# Patient Record
Sex: Female | Born: 1970 | Race: Black or African American | Hispanic: No | Marital: Single | Smoking: Never smoker
Health system: Southern US, Community
[De-identification: ages and names within clinical notes are randomized; demographics above are authoritative.]

## PROBLEM LIST (undated history)

## (undated) DIAGNOSIS — G4730 Sleep apnea, unspecified: Secondary | ICD-10-CM

## (undated) DIAGNOSIS — I10 Essential (primary) hypertension: Secondary | ICD-10-CM

## (undated) DIAGNOSIS — E669 Obesity, unspecified: Secondary | ICD-10-CM

## (undated) DIAGNOSIS — M1990 Unspecified osteoarthritis, unspecified site: Secondary | ICD-10-CM

## (undated) DIAGNOSIS — K219 Gastro-esophageal reflux disease without esophagitis: Secondary | ICD-10-CM

## (undated) HISTORY — PX: CHOLECYSTECTOMY: SHX55

---

## 1999-05-23 HISTORY — PX: CHOLECYSTECTOMY: SHX55

## 2019-05-28 ENCOUNTER — Other Ambulatory Visit: Payer: Self-pay | Admitting: Cardiology

## 2019-05-28 DIAGNOSIS — Z20822 Contact with and (suspected) exposure to covid-19: Secondary | ICD-10-CM

## 2019-05-29 LAB — NOVEL CORONAVIRUS, NAA: SARS-CoV-2, NAA: NOT DETECTED

## 2019-09-25 ENCOUNTER — Encounter (HOSPITAL_COMMUNITY): Payer: Self-pay | Admitting: Emergency Medicine

## 2019-09-25 ENCOUNTER — Emergency Department (HOSPITAL_COMMUNITY): Payer: 59

## 2019-09-25 ENCOUNTER — Emergency Department (HOSPITAL_COMMUNITY)
Admission: EM | Admit: 2019-09-25 | Discharge: 2019-09-26 | Disposition: A | Discharge status: Home / Self Care | Payer: 59 | Attending: Emergency Medicine | Admitting: Emergency Medicine

## 2019-09-25 DIAGNOSIS — W010XXA Fall on same level from slipping, tripping and stumbling without subsequent striking against object, initial encounter: Secondary | ICD-10-CM | POA: Diagnosis not present

## 2019-09-25 DIAGNOSIS — M79605 Pain in left leg: Secondary | ICD-10-CM | POA: Diagnosis present

## 2019-09-25 DIAGNOSIS — Y999 Unspecified external cause status: Secondary | ICD-10-CM | POA: Insufficient documentation

## 2019-09-25 DIAGNOSIS — Y929 Unspecified place or not applicable: Secondary | ICD-10-CM | POA: Diagnosis not present

## 2019-09-25 DIAGNOSIS — W19XXXA Unspecified fall, initial encounter: Secondary | ICD-10-CM

## 2019-09-25 DIAGNOSIS — Y9301 Activity, walking, marching and hiking: Secondary | ICD-10-CM | POA: Insufficient documentation

## 2019-09-25 MED ORDER — HYDROCODONE-ACETAMINOPHEN 5-325 MG PO TABS: 1.0000 | ORAL_TABLET | Freq: Four times a day (QID) | ORAL | 0 refills | Status: AC | PRN

## 2019-09-25 MED ORDER — HYDROCODONE-ACETAMINOPHEN 5-325 MG PO TABS
1.0000 | ORAL_TABLET | Freq: Once | ORAL | Status: AC
  Administered 2019-09-25: 1 via ORAL
  Filled 2019-09-25: qty 1

## 2019-09-25 NOTE — ED Triage Notes (Signed)
Pt arrives via [facility] after having a fall off the sidewalk today. States she tripped and heard a pop in her L upper thigh. C/o pain to leg and states she is unable to bear weight. Received 111mcg fentanyl pta. A/oxx4, denies LOC. Swelling noted to leg.

## 2019-09-25 NOTE — ED Notes (Signed)
Pt transported to CT at this time.

## 2019-09-25 NOTE — ED Provider Notes (Signed)
[facility] EMERGENCY DEPARTMENT Provider Note   CSN: [id number] Arrival date & time: 09/25/19  1431     History Chief Complaint  Patient presents with  . Leg Pain    Brandy [place] is a 49 y.o. female.  HPI Patient reports to the ED for left leg pain.  Earlier today, she was walking down 2 steps.  She slipped, felt a pop, and felt as if her knee gave out.  She is not sure where she felt the pop.  She fell, landing on her right side.  Since that time, she has had pain in the region just above her lateral left knee.  She has not been able to walk or bear weight.  She has no surgical history to the left leg.  She denies any suspected injury on her right side, where she fell.  She denies any LOC associated with fall.    History reviewed. No pertinent past medical history.  There are no problems to display for this patient.   History reviewed. No pertinent surgical history.   OB History   No obstetric history on file.     No family history on file.  Social History   Tobacco Use  . Smoking status: Not on file  Substance Use Topics  . Alcohol use: Not on file  . Drug use: Not on file    Home Medications Prior to Admission medications   Medication Sig Start Date End Date Taking? Authorizing Provider  HYDROcodone-acetaminophen (NORCO/VICODIN) 5-325 MG tablet Take 1 tablet by mouth every 6 (six) hours as needed for up to 3 days for severe pain. 09/25/19 09/28/19  Godfrey Pick, MD    Allergies    Tramadol  Review of Systems   Review of Systems  Constitutional: Negative for activity change, appetite change, chills, fatigue and fever.  HENT: Negative for ear pain and sore throat.   Eyes: Negative for pain and visual disturbance.  Respiratory: Negative for cough and shortness of breath.   Cardiovascular: Negative for chest pain and palpitations.  Gastrointestinal: Negative for abdominal distention, abdominal pain, nausea and vomiting.  Genitourinary: Negative  for dysuria, flank pain, hematuria and pelvic pain.  Musculoskeletal: Positive for arthralgias, gait problem and joint swelling. Negative for back pain and neck pain.  Skin: Negative for color change and rash.  Neurological: Negative for dizziness, seizures, syncope, weakness, light-headedness and headaches.  Hematological: Does not bruise/bleed easily.  All other systems reviewed and are negative.   Physical Exam Updated Vital Signs BP (!) 144/90 (BP Location: Right Arm)   Pulse 99   Temp 100.1 F (37.8 C) (Oral)   Resp 16   LMP 09/17/2019 (Within Days)   SpO2 99%   Physical Exam Vitals and nursing note reviewed.  Constitutional:      General: She is not in acute distress.    Appearance: She is well-developed. She is obese. She is not ill-appearing, toxic-appearing or diaphoretic.  HENT:     Head: Normocephalic and atraumatic.     Right Ear: External ear normal.     Left Ear: External ear normal.     Nose: Nose normal.  Eyes:     Extraocular Movements: Extraocular movements intact.     Conjunctiva/sclera: Conjunctivae normal.  Cardiovascular:     Rate and Rhythm: Normal rate and regular rhythm.     Heart sounds: No murmur.  Pulmonary:     Effort: Pulmonary effort is normal. No respiratory distress.     Breath sounds: Normal  breath sounds. No wheezing.  Chest:     Chest wall: No tenderness.  Abdominal:     General: There is no distension.     Palpations: Abdomen is soft.     Tenderness: There is no abdominal tenderness. There is no right CVA tenderness, left CVA tenderness or rebound.  Musculoskeletal:        General: Swelling, tenderness and signs of injury present.     Cervical back: Neck supple. No rigidity or tenderness.     Right lower leg: No edema.     Left lower leg: No edema.  Skin:    General: Skin is warm and dry.     Capillary Refill: Capillary refill takes less than 2 seconds.     Coloration: Skin is not jaundiced.     Findings: No bruising or  erythema.  Neurological:     General: No focal deficit present.     Mental Status: She is alert and oriented to person, place, and time.     Cranial Nerves: No cranial nerve deficit.     Sensory: No sensory deficit.     Motor: No weakness.  Psychiatric:        Mood and Affect: Mood normal.        Behavior: Behavior normal.     ED Results / Procedures / Treatments   Labs (all labs ordered are listed, but only abnormal results are displayed) Labs Reviewed - No data to display  EKG None  Radiology CT Knee Left Wo Contrast  Result Date: 09/25/2019 CLINICAL DATA:  Knee pain. EXAM: CT OF THE LEFT KNEE WITHOUT CONTRAST TECHNIQUE: Multidetector CT imaging of the LEFT knee was performed according to the standard protocol. Multiplanar CT image reconstructions were also generated. COMPARISON:  X-ray from same day FINDINGS: Bones/Joint/Cartilage There is no acute displaced fracture or dislocation. Mild-to-moderate tricompartmental osteoarthritis is noted. Ligaments Suboptimally assessed by CT. Muscles and Tendons There is slight irregularity of the quadriceps tendon. The patellar tendon appears unremarkable by CT standards. Soft tissues There is a trace suprapatellar joint effusion. IMPRESSION: 1. No acute displaced fracture or dislocation. 2. Slight irregularity of the quadriceps tendon. This could represent a partial tear. A follow-up nonemergent MRI is recommended for further evaluation. 3. Trace joint effusion. 4. Tricompartmental osteoarthritis. Electronically Signed   By: Constance Holster M.D.   On: 09/25/2019 21:08   DG FEMUR MIN 2 VIEWS LEFT  Result Date: 09/25/2019 CLINICAL DATA:  Left anterior knee pain EXAM: LEFT FEMUR 2 VIEWS COMPARISON:  None. FINDINGS: No fracture or malalignment.  Degenerative changes at the knee. IMPRESSION: No acute osseous abnormality Electronically Signed   By: Donavan Foil M.D.   On: 09/25/2019 15:16    Procedures Procedures (including critical care  time)  Medications Ordered in ED Medications  HYDROcodone-acetaminophen (NORCO/VICODIN) 5-325 MG per tablet 1 tablet (1 tablet Oral Given 09/25/19 2024)  HYDROcodone-acetaminophen (NORCO/VICODIN) 5-325 MG per tablet 1 tablet (1 tablet Oral Given 09/25/19 2354)    ED Course  I have reviewed the triage vital signs and the nursing notes.  Pertinent labs & imaging results that were available during my care of the patient were reviewed by me and considered in my medical decision making (see chart for details).    MDM Rules/Calculators/A&P                      Patient is a 49 year old female who presents for acute injury to her left leg.  At time of injury,  but is unsure of where the pop occurred on her leg.  She had a fall, during which she does not suspect any further injuries.  She has since had persistent pain in the superolateral area of her knee.  In the ED, patient is found sitting up in bed, in no acute distress.  There is no evidence of trauma to her right arm, which she landed on when she fell.  She does have pain and in the area of her superior knee/distal thigh.  Pain is worsened with active and passive flexion of knee.  She is able to extend knee although strength of extension is limited by pain.  She does not have any joint line tenderness.  There is no appreciable laxity of knee joint.  There does appear to be a small area of swelling superior to her patella.  Exam is limited due to habitus.   X-ray of the femur, which includes radiographic visualization of the knee is unremarkable.  Given the severity of pain with range of motion of knee, CT of knee was ordered.  CT scan of knee shows possible partial tear of quadriceps tendon, which is consistent with exam.  Knee was immobilized in position of comfort using Ace wrap bandages.  Patient was provided crutches.  She was advised to call orthopedic surgery for follow-up and possible MRI.  Patient stated that she is from [facility] and has an  orthopedic surgeon that she sees at home.  She will follow up with her own orthopedist.  Work note was provided.  Short course of narcotic pain medication was prescribed.  Patient was discharged in stable condition.  Final Clinical Impression(s) / ED Diagnoses Final diagnoses:  Left leg pain    Rx / DC Orders ED Discharge Orders         Ordered    HYDROcodone-acetaminophen (NORCO/VICODIN) 5-325 MG tablet  Every 6 hours PRN     09/25/19 2340           Godfrey Pick, MD 09/26/19 0236    Deno Etienne, DO 09/26/19 1512

## 2019-09-25 NOTE — Progress Notes (Signed)
Orthopedic Tech Progress Note Patient Details:  [place]andy [place] 1970/12/28 [id number]  Ortho Devices Type of Ortho Device: Crutches, Ace wrap Ortho Device/Splint Location: LLE Ortho Device/Splint Interventions: Application   Post Interventions Patient Tolerated: Well Instructions Provided: Adjustment of device, Poper ambulation with device   Brandy [place] [name] 09/25/2019, 11:59 PM

## 2019-09-26 NOTE — ED Notes (Signed)
Patient verbalizes understanding of discharge instructions. Opportunity for questioning and answers were provided. Armband removed by staff, pt discharged from ED in wheelchair to home.   

## 2019-10-02 DIAGNOSIS — Z6841 Body Mass Index (BMI) 40.0 and over, adult: Secondary | ICD-10-CM | POA: Insufficient documentation

## 2019-10-15 HISTORY — PX: TENDON REPAIR: SHX5111

## 2019-11-21 DIAGNOSIS — G4733 Obstructive sleep apnea (adult) (pediatric): Secondary | ICD-10-CM | POA: Diagnosis not present

## 2019-12-03 DIAGNOSIS — R262 Difficulty in walking, not elsewhere classified: Secondary | ICD-10-CM | POA: Diagnosis not present

## 2019-12-03 DIAGNOSIS — Z4789 Encounter for other orthopedic aftercare: Secondary | ICD-10-CM | POA: Diagnosis not present

## 2019-12-03 DIAGNOSIS — M6281 Muscle weakness (generalized): Secondary | ICD-10-CM | POA: Diagnosis not present

## 2019-12-15 DIAGNOSIS — R262 Difficulty in walking, not elsewhere classified: Secondary | ICD-10-CM | POA: Diagnosis not present

## 2019-12-15 DIAGNOSIS — M6281 Muscle weakness (generalized): Secondary | ICD-10-CM | POA: Diagnosis not present

## 2019-12-15 DIAGNOSIS — Z4789 Encounter for other orthopedic aftercare: Secondary | ICD-10-CM | POA: Diagnosis not present

## 2019-12-22 DIAGNOSIS — G4733 Obstructive sleep apnea (adult) (pediatric): Secondary | ICD-10-CM | POA: Diagnosis not present

## 2019-12-24 DIAGNOSIS — Z4789 Encounter for other orthopedic aftercare: Secondary | ICD-10-CM | POA: Diagnosis not present

## 2019-12-24 DIAGNOSIS — M6281 Muscle weakness (generalized): Secondary | ICD-10-CM | POA: Diagnosis not present

## 2019-12-24 DIAGNOSIS — R262 Difficulty in walking, not elsewhere classified: Secondary | ICD-10-CM | POA: Diagnosis not present

## 2019-12-25 DIAGNOSIS — G4733 Obstructive sleep apnea (adult) (pediatric): Secondary | ICD-10-CM | POA: Diagnosis not present

## 2019-12-31 DIAGNOSIS — M6281 Muscle weakness (generalized): Secondary | ICD-10-CM | POA: Diagnosis not present

## 2019-12-31 DIAGNOSIS — Z4789 Encounter for other orthopedic aftercare: Secondary | ICD-10-CM | POA: Diagnosis not present

## 2019-12-31 DIAGNOSIS — R262 Difficulty in walking, not elsewhere classified: Secondary | ICD-10-CM | POA: Diagnosis not present

## 2020-01-07 DIAGNOSIS — M6281 Muscle weakness (generalized): Secondary | ICD-10-CM | POA: Diagnosis not present

## 2020-01-07 DIAGNOSIS — Z4789 Encounter for other orthopedic aftercare: Secondary | ICD-10-CM | POA: Diagnosis not present

## 2020-01-07 DIAGNOSIS — R262 Difficulty in walking, not elsewhere classified: Secondary | ICD-10-CM | POA: Diagnosis not present

## 2020-01-14 DIAGNOSIS — R262 Difficulty in walking, not elsewhere classified: Secondary | ICD-10-CM | POA: Diagnosis not present

## 2020-01-14 DIAGNOSIS — M6281 Muscle weakness (generalized): Secondary | ICD-10-CM | POA: Diagnosis not present

## 2020-01-14 DIAGNOSIS — Z4789 Encounter for other orthopedic aftercare: Secondary | ICD-10-CM | POA: Diagnosis not present

## 2020-01-15 DIAGNOSIS — Z79899 Other long term (current) drug therapy: Secondary | ICD-10-CM | POA: Diagnosis not present

## 2020-01-15 DIAGNOSIS — E559 Vitamin D deficiency, unspecified: Secondary | ICD-10-CM | POA: Diagnosis not present

## 2020-01-15 DIAGNOSIS — I10 Essential (primary) hypertension: Secondary | ICD-10-CM | POA: Diagnosis not present

## 2020-01-15 DIAGNOSIS — G4733 Obstructive sleep apnea (adult) (pediatric): Secondary | ICD-10-CM | POA: Diagnosis not present

## 2020-01-15 DIAGNOSIS — Z6841 Body Mass Index (BMI) 40.0 and over, adult: Secondary | ICD-10-CM | POA: Diagnosis not present

## 2020-01-21 DIAGNOSIS — M6281 Muscle weakness (generalized): Secondary | ICD-10-CM | POA: Diagnosis not present

## 2020-01-21 DIAGNOSIS — R262 Difficulty in walking, not elsewhere classified: Secondary | ICD-10-CM | POA: Diagnosis not present

## 2020-01-21 DIAGNOSIS — Z4789 Encounter for other orthopedic aftercare: Secondary | ICD-10-CM | POA: Diagnosis not present

## 2020-01-22 DIAGNOSIS — G4733 Obstructive sleep apnea (adult) (pediatric): Secondary | ICD-10-CM | POA: Diagnosis not present

## 2020-01-28 DIAGNOSIS — G4733 Obstructive sleep apnea (adult) (pediatric): Secondary | ICD-10-CM | POA: Diagnosis not present

## 2020-02-21 DIAGNOSIS — G4733 Obstructive sleep apnea (adult) (pediatric): Secondary | ICD-10-CM | POA: Diagnosis not present

## 2020-03-01 DIAGNOSIS — G4733 Obstructive sleep apnea (adult) (pediatric): Secondary | ICD-10-CM | POA: Diagnosis not present

## 2020-03-02 DIAGNOSIS — Z23 Encounter for immunization: Secondary | ICD-10-CM | POA: Diagnosis not present

## 2020-03-23 DIAGNOSIS — G4733 Obstructive sleep apnea (adult) (pediatric): Secondary | ICD-10-CM | POA: Diagnosis not present

## 2020-03-24 DIAGNOSIS — E559 Vitamin D deficiency, unspecified: Secondary | ICD-10-CM | POA: Diagnosis not present

## 2020-03-24 DIAGNOSIS — Z131 Encounter for screening for diabetes mellitus: Secondary | ICD-10-CM | POA: Diagnosis not present

## 2020-03-24 DIAGNOSIS — Z1329 Encounter for screening for other suspected endocrine disorder: Secondary | ICD-10-CM | POA: Diagnosis not present

## 2020-03-24 DIAGNOSIS — Z0000 Encounter for general adult medical examination without abnormal findings: Secondary | ICD-10-CM | POA: Diagnosis not present

## 2020-03-24 DIAGNOSIS — Z13220 Encounter for screening for lipoid disorders: Secondary | ICD-10-CM | POA: Diagnosis not present

## 2020-03-31 DIAGNOSIS — R7302 Impaired glucose tolerance (oral): Secondary | ICD-10-CM | POA: Diagnosis not present

## 2020-03-31 DIAGNOSIS — E559 Vitamin D deficiency, unspecified: Secondary | ICD-10-CM | POA: Diagnosis not present

## 2020-03-31 DIAGNOSIS — I10 Essential (primary) hypertension: Secondary | ICD-10-CM | POA: Diagnosis not present

## 2020-03-31 DIAGNOSIS — Z6841 Body Mass Index (BMI) 40.0 and over, adult: Secondary | ICD-10-CM | POA: Diagnosis not present

## 2020-03-31 DIAGNOSIS — Z0000 Encounter for general adult medical examination without abnormal findings: Secondary | ICD-10-CM | POA: Diagnosis not present

## 2020-04-01 DIAGNOSIS — G4733 Obstructive sleep apnea (adult) (pediatric): Secondary | ICD-10-CM | POA: Diagnosis not present

## 2020-04-22 DIAGNOSIS — G4733 Obstructive sleep apnea (adult) (pediatric): Secondary | ICD-10-CM | POA: Diagnosis not present

## 2020-05-03 DIAGNOSIS — G4733 Obstructive sleep apnea (adult) (pediatric): Secondary | ICD-10-CM | POA: Diagnosis not present

## 2020-05-23 DIAGNOSIS — G4733 Obstructive sleep apnea (adult) (pediatric): Secondary | ICD-10-CM | POA: Diagnosis not present

## 2020-06-02 DIAGNOSIS — G4733 Obstructive sleep apnea (adult) (pediatric): Secondary | ICD-10-CM | POA: Diagnosis not present

## 2020-06-23 DIAGNOSIS — G4733 Obstructive sleep apnea (adult) (pediatric): Secondary | ICD-10-CM | POA: Diagnosis not present

## 2020-07-21 DIAGNOSIS — G4733 Obstructive sleep apnea (adult) (pediatric): Secondary | ICD-10-CM | POA: Diagnosis not present

## 2020-08-21 DIAGNOSIS — G4733 Obstructive sleep apnea (adult) (pediatric): Secondary | ICD-10-CM | POA: Diagnosis not present

## 2020-08-31 DIAGNOSIS — G4733 Obstructive sleep apnea (adult) (pediatric): Secondary | ICD-10-CM | POA: Diagnosis not present

## 2020-09-06 ENCOUNTER — Telehealth: Payer: Self-pay

## 2020-09-06 DIAGNOSIS — R7612 Nonspecific reaction to cell mediated immunity measurement of gamma interferon antigen response without active tuberculosis: Secondary | ICD-10-CM

## 2020-09-06 NOTE — Telephone Encounter (Signed)
Attempted TC to patient.  [facility] RN Aileen Fass, RN   Referred from [facility] at work.   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  Lab reports and CXR sent for scanning Aileen Fass, RN   After review of care everywhere, it appears patient had a CXR in 2018 for a TB screen which may indicate a hx of +PPDs or +QFTs. Will await return call from patient to get hx. If patient has had any known TB exposures or is having TB sx will need current CXR.  Aileen Fass, RN

## 2020-09-08 ENCOUNTER — Ambulatory Visit (LOCAL_COMMUNITY_HEALTH_CENTER): Payer: 59

## 2020-09-08 VITALS — Wt 265.0 lb

## 2020-09-08 DIAGNOSIS — R7612 Nonspecific reaction to cell mediated immunity measurement of gamma interferon antigen response without active tuberculosis: Secondary | ICD-10-CM

## 2020-09-08 DIAGNOSIS — I10 Essential (primary) hypertension: Secondary | ICD-10-CM

## 2020-09-09 DIAGNOSIS — R7612 Nonspecific reaction to cell mediated immunity measurement of gamma interferon antigen response without active tuberculosis: Secondary | ICD-10-CM | POA: Insufficient documentation

## 2020-09-09 DIAGNOSIS — I10 Essential (primary) hypertension: Secondary | ICD-10-CM | POA: Insufficient documentation

## 2020-09-09 NOTE — Progress Notes (Signed)
EPI completed via phone. Referred from [facility] at work for +QFT and CXR without Active TB.   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  She completed LTBI tx, "took several months of medicine" when she was 50 yo; reports her grandfather had TB. Patient recently moved to [place] from [place] and doesn't have PCP; currently a CNA at [employer].  Instructed to f/u with health dept if sx's of Active TB (reviewed with patient). Patient verbalized understanding Aileen Fass, RN

## 2020-09-16 ENCOUNTER — Ambulatory Visit
Admission: EM | Admit: 2020-09-16 | Discharge: 2020-09-16 | Disposition: A | Discharge status: Home / Self Care | Payer: 59 | Attending: Emergency Medicine | Admitting: Emergency Medicine

## 2020-09-16 ENCOUNTER — Emergency Department (HOSPITAL_COMMUNITY)
Admission: EM | Admit: 2020-09-16 | Discharge: 2020-09-16 | Disposition: A | Discharge status: Home / Self Care | Payer: 59 | Attending: Emergency Medicine | Admitting: Emergency Medicine

## 2020-09-16 ENCOUNTER — Other Ambulatory Visit: Payer: Self-pay

## 2020-09-16 ENCOUNTER — Emergency Department (HOSPITAL_COMMUNITY): Payer: 59

## 2020-09-16 ENCOUNTER — Encounter (HOSPITAL_COMMUNITY): Payer: Self-pay | Admitting: Emergency Medicine

## 2020-09-16 DIAGNOSIS — R42 Dizziness and giddiness: Secondary | ICD-10-CM

## 2020-09-16 DIAGNOSIS — R9431 Abnormal electrocardiogram [ECG] [EKG]: Secondary | ICD-10-CM

## 2020-09-16 DIAGNOSIS — Z79899 Other long term (current) drug therapy: Secondary | ICD-10-CM | POA: Diagnosis not present

## 2020-09-16 DIAGNOSIS — I10 Essential (primary) hypertension: Secondary | ICD-10-CM | POA: Insufficient documentation

## 2020-09-16 HISTORY — DX: Essential (primary) hypertension: I10

## 2020-09-16 HISTORY — DX: Obesity, unspecified: E66.9

## 2020-09-16 LAB — CBC WITH DIFFERENTIAL/PLATELET
Abs Immature Granulocytes: 0.04 10*3/uL (ref 0.00–0.07)
Basophils Absolute: 0 10*3/uL (ref 0.0–0.1)
Basophils Relative: 1 %
Eosinophils Absolute: 0 10*3/uL (ref 0.0–0.5)
Eosinophils Relative: 1 %
HCT: 41.4 % (ref 36.0–46.0)
Hemoglobin: 13.1 g/dL (ref 12.0–15.0)
Immature Granulocytes: 1 %
Lymphocytes Relative: 21 %
Lymphs Abs: 1.7 10*3/uL (ref 0.7–4.0)
MCH: 27 pg (ref 26.0–34.0)
MCHC: 31.6 g/dL (ref 30.0–36.0)
MCV: 85.2 fL (ref 80.0–100.0)
Monocytes Absolute: 0.6 10*3/uL (ref 0.1–1.0)
Monocytes Relative: 7 %
Neutro Abs: 5.5 10*3/uL (ref 1.7–7.7)
Neutrophils Relative %: 69 %
Platelets: 307 10*3/uL (ref 150–400)
RBC: 4.86 MIL/uL (ref 3.87–5.11)
RDW: 13.8 % (ref 11.5–15.5)
WBC: 7.8 10*3/uL (ref 4.0–10.5)
nRBC: 0 % (ref 0.0–0.2)

## 2020-09-16 LAB — COMPREHENSIVE METABOLIC PANEL
ALT: 24 U/L (ref 0–44)
AST: 21 U/L (ref 15–41)
Albumin: 4.2 g/dL (ref 3.5–5.0)
Alkaline Phosphatase: 55 U/L (ref 38–126)
Anion gap: 9 (ref 5–15)
BUN: 14 mg/dL (ref 6–20)
CO2: 27 mmol/L (ref 22–32)
Calcium: 9.7 mg/dL (ref 8.9–10.3)
Chloride: 100 mmol/L (ref 98–111)
Creatinine, Ser: 0.81 mg/dL (ref 0.44–1.00)
GFR, Estimated: >60 mL/min (ref >60)
Glucose, Bld: 115 mg/dL — ABNORMAL HIGH (ref 70–99)
Potassium: 3.6 mmol/L (ref 3.5–5.1)
Sodium: 136 mmol/L (ref 135–145)
Total Bilirubin: 1.1 mg/dL (ref 0.3–1.2)
Total Protein: 8.2 g/dL — ABNORMAL HIGH (ref 6.5–8.1)

## 2020-09-16 LAB — PREGNANCY, URINE: Preg Test, Ur: NEGATIVE

## 2020-09-16 LAB — I-STAT CHEM 8, ED
BUN: 13 mg/dL (ref 6–20)
Calcium, Ion: 1.19 mmol/L (ref 1.15–1.40)
Chloride: 97 mmol/L — ABNORMAL LOW (ref 98–111)
Creatinine, Ser: 0.8 mg/dL (ref 0.44–1.00)
Glucose, Bld: 114 mg/dL — ABNORMAL HIGH (ref 70–99)
HCT: 41 % (ref 36.0–46.0)
Hemoglobin: 13.9 g/dL (ref 12.0–15.0)
Potassium: 3.7 mmol/L (ref 3.5–5.1)
Sodium: 137 mmol/L (ref 135–145)
TCO2: 26 mmol/L (ref 22–32)

## 2020-09-16 LAB — TROPONIN I (HIGH SENSITIVITY)
Troponin I (High Sensitivity): 4 ng/L (ref <18)
Troponin I (High Sensitivity): 4 ng/L (ref <18)

## 2020-09-16 MED ORDER — ONDANSETRON HCL 4 MG/2ML IJ SOLN
4.0000 mg | Freq: Once | INTRAMUSCULAR | Status: AC
  Administered 2020-09-16: 4 mg via INTRAVENOUS
  Filled 2020-09-16: qty 2

## 2020-09-16 MED ORDER — ONDANSETRON 4 MG PO TBDP
4.0000 mg | ORAL_TABLET | Freq: Three times a day (TID) | ORAL | 0 refills | Status: DC | PRN
Start: 2020-09-16 — End: 2022-09-21

## 2020-09-16 MED ORDER — MECLIZINE HCL 12.5 MG PO TABS
12.5000 mg | ORAL_TABLET | Freq: Three times a day (TID) | ORAL | 0 refills | Status: DC | PRN
Start: 2020-09-16 — End: 2022-09-21

## 2020-09-16 MED ORDER — SODIUM CHLORIDE 0.9 % IV BOLUS
1000.0000 mL | Freq: Once | INTRAVENOUS | Status: AC
  Administered 2020-09-16: 1000 mL via INTRAVENOUS

## 2020-09-16 MED ORDER — DEXAMETHASONE SODIUM PHOSPHATE 10 MG/ML IJ SOLN
10.0000 mg | Freq: Once | INTRAMUSCULAR | Status: AC
  Administered 2020-09-16: 10 mg via INTRAVENOUS
  Filled 2020-09-16: qty 1

## 2020-09-16 NOTE — ED Provider Notes (Signed)
[facility]    CSN: [id number] Arrival date & time: 09/16/20  0827      History   Chief Complaint Chief Complaint  Patient presents with  . Dizziness    HPI Brandy [place] is a 50 y.o. female.   Patient presents with "dizziness" since yesterday evening.  She describes the dizziness as a lightheadedness.  No sensation of room spinning.  Onset of symptoms while she was sitting in her recliner; lightheadedness accompanied by 1 episode of emesis.  She states it was time to go to work but she was unable to go because of her symptoms.  She denies chest pain, shortness of breath, focal weakness, numbness, headache, facial asymmetry, confusion, slurred speech, fever, or other symptoms.  Patient also mentions that she did not drink her usual amount of water yesterday or this morning.  Her medical history includes hypertension and morbid obesity.    The history is provided by the patient and medical records.    History reviewed. No pertinent past medical history.  Patient Active Problem List   Diagnosis Date Noted  . 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 Hypertension 09/09/2020  . BMI 45.0-49.9, adult ([place]) 10/02/2019    History reviewed. No pertinent surgical history.  OB History   No obstetric history on file.      Home Medications    Prior to Admission medications   Medication Sig Start Date End Date Taking? Authorizing Provider  meclizine (ANTIVERT) 12.5 MG tablet Take 1 tablet (12.5 mg total) by mouth 3 (three) times daily as needed for dizziness. 09/16/20  Yes Sharion Balloon, NP  hydrochlorothiazide (HYDRODIURIL) 25 MG tablet Take 1 tablet by mouth daily. 07/03/20   [provider]    Family History Family History  Problem Relation Age of Onset  . Sarcoidosis Mother   . Healthy Father     Social History Social History   Tobacco Use  . Smoking status: Never Smoker  . Smokeless tobacco: Never  Used  Vaping Use  . Vaping Use: Never used  Substance Use Topics  . Alcohol use: Not Currently  . Drug use: Not Currently     Allergies   Tramadol   Review of Systems Review of Systems  Constitutional: Negative for chills and fever.  HENT: Negative for ear pain and sore throat.   Eyes: Negative for pain and visual disturbance.  Respiratory: Negative for cough and shortness of breath.   Cardiovascular: Negative for chest pain and palpitations.  Gastrointestinal: Negative for abdominal pain and vomiting.  Genitourinary: Negative for dysuria and hematuria.  Musculoskeletal: Negative for arthralgias and back pain.  Skin: Negative for color change and rash.  Neurological: Positive for dizziness and light-headedness. Negative for tremors, seizures, syncope, facial asymmetry, speech difficulty, weakness, numbness and headaches.  All other systems reviewed and are negative.    Physical Exam Triage Vital Signs ED Triage Vitals  Enc Vitals Group     BP      Pulse      Resp      Temp      Temp src      SpO2      Weight      Height      Head Circumference      Peak Flow      Pain Score      Pain Loc      Pain Edu?      Excl. in [place]?  No data found.  Updated Vital Signs BP 114/75   Pulse 76   Temp 98 F (36.7 C)   Resp 19   LMP 09/08/2020 (Approximate)   SpO2 98%   Visual Acuity Right Eye Distance:   Left Eye Distance:   Bilateral Distance:    Right Eye Near:   Left Eye Near:    Bilateral Near:     Physical Exam Vitals and nursing note reviewed.  Constitutional:      General: She is not in acute distress.    Appearance: She is well-developed. She is ill-appearing.  HENT:     Head: Normocephalic and atraumatic.     Right Ear: Tympanic membrane normal.     Left Ear: Tympanic membrane normal.     Nose: Nose normal.     Mouth/Throat:     Mouth: Mucous membranes are moist.     Pharynx: Oropharynx is clear.  Eyes:     Extraocular Movements: Extraocular  movements intact.     Conjunctiva/sclera: Conjunctivae normal.     Pupils: Pupils are equal, round, and reactive to light.  Cardiovascular:     Rate and Rhythm: Normal rate and regular rhythm.     Heart sounds: Normal heart sounds.  Pulmonary:     Effort: Pulmonary effort is normal. No respiratory distress.     Breath sounds: Normal breath sounds.  Abdominal:     Palpations: Abdomen is soft.     Tenderness: There is no abdominal tenderness.  Musculoskeletal:     Cervical back: Neck supple.  Skin:    General: Skin is warm and dry.  Neurological:     General: No focal deficit present.     Mental Status: She is alert and oriented to person, place, and time.     Cranial Nerves: No cranial nerve deficit.     Sensory: No sensory deficit.     Motor: No weakness.     Coordination: Romberg sign negative.     Gait: Gait normal.  Psychiatric:        Mood and Affect: Mood normal.        Behavior: Behavior normal.      UC Treatments / Results  Labs (all labs ordered are listed, but only abnormal results are displayed) Labs Reviewed - No data to display  EKG   Radiology No results found.  Procedures Procedures (including critical care time)  Medications Ordered in UC Medications - No data to display  Initial Impression / Assessment and Plan / UC Course  I have reviewed the triage vital signs and the nursing notes.  Pertinent labs & imaging results that were available during my care of the patient were reviewed by me and considered in my medical decision making (see chart for details).   Dizziness, abnormal EKG.  EKG shows sinus rhythm, rate 83, no ST elevation, inverted T wave in lead III and aVR, no previous to compare.  Patient has ongoing acute lightheadedness.  She is ill-appearing.  I do not have an old EKG to compare to the current one.  Due to the need to rule out a cardiovascular event, sending patient to the ED for evaluation.  Her daughter drove her here and patient  feels stable for her to take her to the ED by POV.   Final Clinical Impressions(s) / UC Diagnoses   Final diagnoses:  Dizziness  Abnormal EKG     Discharge Instructions     Go to the Emergency Department for evaluation of your  dizziness and abnormal EKG.    Take the meclizine as directed; Do not drive, operate machinery, or drink alcohol with this medication as it may cause drowsiness.         ED Prescriptions    Medication Sig Dispense Auth. Provider   meclizine (ANTIVERT) 12.5 MG tablet Take 1 tablet (12.5 mg total) by mouth 3 (three) times daily as needed for dizziness. 30 tablet Sharion Balloon, NP     I have reviewed the PDMP during this encounter.   Sharion Balloon, NP 09/16/20 [phone]

## 2020-09-16 NOTE — Discharge Instructions (Signed)
  Hydration: Symptoms of most illnesses will be intensified and complicated by dehydration. Dehydration can also extend the duration of symptoms. Drink plenty of fluids and get plenty of rest. You should be drinking at least half a liter of water an hour to stay hydrated. Electrolyte drinks (ex. Gatorade, Powerade, Pedialyte) are also encouraged. You should be drinking enough fluids to make your urine light yellow, almost clear. If this is not the case, you are not drinking enough water.  Meclizine: May use the meclizine, as needed, for episodes of dizziness.  Use caution as this medication can sometimes cause drowsiness as well.  Nausea/vomiting: Use the ondansetron (generic for Zofran) for nausea or vomiting.  This medication may not prevent all vomiting or nausea, but can help facilitate better hydration. Things that can help with nausea/vomiting also include peppermint/menthol candies, vitamin B12, and ginger.  Follow-up: Follow-up with a primary care provider or neurologist for any further management of this issue.  Return: Return for persistent dizziness, falls, unsteadiness, uncontrolled vomiting, chest pain, shortness of breath, or any other major concerns.

## 2020-09-16 NOTE — ED Provider Notes (Addendum)
[facility] DEPT Provider Note   CSN: [id number] Arrival date & time: 09/16/20  2951     History Chief Complaint  Patient presents with  . Dizziness    Brandy Nelson is a 50 y.o. female.  HPI      Brandy Nelson is a 50 y.o. female, with a history of hypertension, obesity, presenting to the ED with complaint of dizziness and unsteadiness on her feet beginning yesterday evening around 5 PM while sitting at rest. She states the sensation then only occurred when she would stand or attempt to walk.  She felt unsteady on her feet with walking.  She has not experienced this before.  She endorses an episode of nausea and vomiting last night and one this morning accompanying the feeling of unsteadiness.  She does not have any abnormal sensations with movement of her head or neck, when she is sitting, or when she moves her eyes, only when she stands up.  She denies any recent illness, new medications, head injury.  Denies fever/chills, neck pain/stiffness, headaches, vision changes, ear pain/drainage, diarrhea, chest pain, shortness of breath, lower extremity swelling/pain, weakness, numbness, syncope, or any other complaints.      Past Medical History:  Diagnosis Date  . Hypertension   . Obesity     Patient Active Problem List   Diagnosis Date Noted  . 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 Hypertension 09/09/2020  . BMI 45.0-49.9, adult ([place]) 10/02/2019    Past Surgical History:  Procedure Laterality Date  . CHOLECYSTECTOMY       OB History   No obstetric history on file.     Family History  Problem Relation Age of Onset  . Sarcoidosis Mother   . Healthy Father     Social History   Tobacco Use  . Smoking status: Never Smoker  . Smokeless tobacco: Never Used  Vaping Use  . Vaping Use: Never used  Substance Use Topics  . Alcohol use: Not Currently  . Drug use: Not Currently     Home Medications Prior to Admission medications   Medication Sig Start Date End Date Taking? Authorizing Provider  hydrochlorothiazide (HYDRODIURIL) 25 MG tablet Take 25 mg by mouth daily. 07/03/20  Yes [provider]  ondansetron (ZOFRAN ODT) 4 MG disintegrating tablet Take 1 tablet (4 mg total) by mouth every 8 (eight) hours as needed for nausea or vomiting. 09/16/20  Yes Jahree Dermody C, PA-C  meclizine (ANTIVERT) 12.5 MG tablet Take 1 tablet (12.5 mg total) by mouth 3 (three) times daily as needed for dizziness. 09/16/20   Sharion Balloon, NP    Allergies    Tramadol  Review of Systems   Review of Systems  Constitutional: Negative for chills, diaphoresis and fever.  Eyes: Negative for visual disturbance.  Respiratory: Negative for shortness of breath.   Cardiovascular: Negative for chest pain.  Gastrointestinal: Positive for nausea and vomiting. Negative for abdominal pain and diarrhea.  Genitourinary: Negative for difficulty urinating and dysuria.  Musculoskeletal: Negative for neck pain and neck stiffness.  Neurological: Positive for light-headedness. Negative for syncope, facial asymmetry, speech difficulty, weakness, numbness and headaches.  Psychiatric/Behavioral: Negative for confusion.    Physical Exam Updated Vital Signs BP (!) 150/73   Pulse 88   Temp 98.1 F (36.7 C) (Oral)   Resp 16   Ht 5\' 2"  (1.575 m)   Wt 120.2 kg   LMP 09/08/2020 (Approximate)   SpO2 98%   BMI  48.47 kg/m   Physical Exam Vitals and nursing note reviewed.  Constitutional:      General: She is not in acute distress.    Appearance: She is well-developed. She is obese. She is not diaphoretic.  HENT:     Head: Normocephalic and atraumatic.     Mouth/Throat:     Mouth: Mucous membranes are moist.     Pharynx: Oropharynx is clear.  Eyes:     Conjunctiva/sclera: Conjunctivae normal.  Cardiovascular:     Rate and Rhythm: Normal rate and regular rhythm.     Pulses: Normal pulses.           Radial pulses are 2+ on the right side and 2+ on the left side.       Posterior tibial pulses are 2+ on the right side and 2+ on the left side.     Heart sounds: Normal heart sounds.     Comments: Tactile temperature in the extremities appropriate and equal bilaterally. Pulmonary:     Effort: Pulmonary effort is normal. No respiratory distress.     Breath sounds: Normal breath sounds.  Abdominal:     Palpations: Abdomen is soft.     Tenderness: There is no abdominal tenderness. There is no guarding.  Musculoskeletal:     Cervical back: Neck supple.     Right lower leg: No edema.     Left lower leg: No edema.  Lymphadenopathy:     Cervical: No cervical adenopathy.  Skin:    General: Skin is warm and dry.  Neurological:     Mental Status: She is alert and oriented to person, place, and time.     Comments: No noted onset of symptoms when sitting patient upright, turning her head side to side, or with EOM testing. No noted acute cognitive deficit. Sensation grossly intact to light touch in the extremities.   Grip strengths equal bilaterally.   Strength 5/5 in all extremities.  No gait disturbance.  Coordination intact.  Cranial nerves III-XII grossly intact.  Handles oral secretions without noted difficulty.  No noted phonation or speech deficit. No facial droop.   Psychiatric:        Mood and Affect: Mood and affect normal.        Speech: Speech normal.        Behavior: Behavior normal.     ED Results / Procedures / Treatments   Labs (all labs ordered are listed, but only abnormal results are displayed) Labs Reviewed  COMPREHENSIVE METABOLIC PANEL - Abnormal; Notable for the following components:      Result Value   Glucose, Bld 115 (*)    Total Protein 8.2 (*)    All other components within normal limits  I-STAT CHEM 8, ED - Abnormal; Notable for the following components:   Chloride 97 (*)    Glucose, Bld 114 (*)    All other components within normal limits  CBC  WITH DIFFERENTIAL/PLATELET  PREGNANCY, URINE  TROPONIN I (HIGH SENSITIVITY)  TROPONIN I (HIGH SENSITIVITY)    EKG EKG Interpretation  Date/Time:  Thursday September 16 2020 10:00:59 EDT Ventricular Rate:  84 PR Interval:  138 QRS Duration: 72 QT Interval:  392 QTC Calculation: 463 R Axis:   56 Text Interpretation: Normal sinus rhythm Cannot rule out Anterior infarct , age undetermined Abnormal ECG Confirmed by Lennice Sites [phone]) on 09/16/2020 1:21:11 PM   Radiology CT Head Wo Contrast  Result Date: 09/16/2020 CLINICAL DATA:  Dizziness starting at 5 p.m. yesterday. EXAM:  CT HEAD WITHOUT CONTRAST TECHNIQUE: Contiguous axial images were obtained from the base of the skull through the vertex without intravenous contrast. COMPARISON:  None. FINDINGS: Brain: The brainstem, cerebellum, cerebral peduncles, thalami, basal ganglia, basilar cisterns, and ventricular system appear within normal limits. No intracranial hemorrhage, mass lesion, or acute CVA. Vascular: Unremarkable Skull: Unremarkable Sinuses/Orbits: Unremarkable Other: No supplemental non-categorized findings. IMPRESSION: No significant abnormality is identified to account for the patient's current symptoms. Electronically Signed   By: Van Clines M.D.   On: 09/16/2020 11:02    Procedures Procedures   Medications Ordered in ED Medications  sodium chloride 0.9 % bolus 1,000 mL (0 mLs Intravenous Stopped 09/16/20 1425)  ondansetron (ZOFRAN) injection 4 mg (4 mg Intravenous Given 09/16/20 1158)  dexamethasone (DECADRON) injection 10 mg (10 mg Intravenous Given 09/16/20 1157)    ED Course  I have reviewed the triage vital signs and the nursing notes.  Pertinent labs & imaging results that were available during my care of the patient were reviewed by me and considered in my medical decision making (see chart for details).  Clinical Course as of 09/16/20 1614  Thu Sep 16, 2020  1335 Patient states her symptoms have resolved.   She overall feels much better than she did.  She walked to the bathroom without need for assistance, onset of dizziness, or instability. [SJ]    Clinical Course User Index [SJ] Trentan Trippe, Helane Gunther, PA-C   MDM Rules/Calculators/A&P                          Patient presents with lightheadedness and feeling unsteady beginning yesterday.  Upon my initial assessment, patient had no neurologic dysfunction, no focal deficits.  She ambulated without assistance.   During her time under my care, her symptoms did not recur.  They did not recur with changes in position, head movement, eye movement, or ambulation. For these reasons, my suspicion for central dysfunction, such as stroke, is low. Despite this, I discussed MRI with the patient and offered this imaging study, but patient declined. Patient will follow up with PCP on this matter.  Return precautions discussed.  Patient voices understanding of these instructions, accepts the plan, and is comfortable with discharge.  I reviewed and interpreted the patient's labs and radiological studies.   Findings and plan of care discussed with attending physician, Lennice Sites, DO.  Vitals:   09/16/20 1116 09/16/20 1224 09/16/20 1300 09/16/20 1400  BP: (!) 150/73 (!) 148/82 (!) 163/84 (!) 150/86  Pulse: 88 78 83 86  Resp: 16 19 19 18   Temp:      TempSrc:      SpO2: 98% 96% 93% 100%  Weight:      Height:         Final Clinical Impression(s) / ED Diagnoses Final diagnoses:  Lightheadedness    Rx / DC Orders ED Discharge Orders         Ordered    ondansetron (ZOFRAN ODT) 4 MG disintegrating tablet  Every 8 hours PRN        09/16/20 1410           Carmyn Hamm, Helane Gunther, PA-C 09/16/20 [place], [place], PA-C 09/16/20 [place], [place], DO 09/17/20 [phone]

## 2020-09-16 NOTE — ED Triage Notes (Signed)
Pt presents with complaints of dizziness that started yesterday. States she stood up at 5pm last night and her head started spinning and has continued since. Patient reports the dizziness is worse when she stands up or holds her head straight up. Pt did have one episode of emesis last night and this morning. Denies pain. Reports unsteady gait when she tries to ambulate. Unable to do orthostatic vital signs during intake due to patients symptoms.

## 2020-09-16 NOTE — ED Triage Notes (Signed)
Emergency Medicine Provider Triage Evaluation Note  Brandy Nelson , a 50 y.o. female  was evaluated in triage.  Pt complains of dizziness, started at 5 PM yesterday, she feels dizzy and off-balance when she goes from a sitting to standing position, states that she has an abnormal gait.  She denies head trauma, not on anticoagulant, denies change in vision, paresthesia weakness upper lower extremities..  Review of Systems  Positive: Dizziness, off-balance Negative: Denies chest pain, shortness breath, abdominal pain  Physical Exam  BP (!) 150/87 (BP Location: Left Arm)   Pulse 80   Temp 98.1 F (36.7 C) (Oral)   Resp 18   Ht 5\' 2"  (1.575 m)   Wt 120.2 kg   LMP 09/08/2020 (Approximate)   SpO2 97%   BMI 48.47 kg/m  Gen:   Awake, no distress   HEENT:  Atraumatic  Resp:  Normal effort  Cardiac:  Normal rate   MSK:   Moves extremities without difficulty  Neuro:  Cranial nerves II through XII are grossly intact, patient has intact coordination upper extremity with finger-to-nose, 5 5 strength upper lower extremities, patient has abnormal gait favoring the left side.  Medical Decision Making  Medically screening exam initiated at 10:18 AM.  Appropriate orders placed.  Brandy Nelson was informed that the remainder of the evaluation will be completed by another provider, this initial triage assessment does not replace that evaluation, and the importance of remaining in the ED until their evaluation is complete.  Clinical Impression  Patient presents with dizziness and feeling off balance, lab work and imaging has been ordered, patient will need further work-up here in the emergency department   Marcello Fennel, PA-C 09/16/20 1019

## 2020-09-16 NOTE — ED Notes (Signed)
Family to bedside at this time.

## 2020-09-16 NOTE — ED Notes (Signed)
Pt transported to CT via wheelchair.

## 2020-09-16 NOTE — Discharge Instructions (Signed)
Go to the Emergency Department for evaluation of your dizziness and abnormal EKG.    Take the meclizine as directed; Do not drive, operate machinery, or drink alcohol with this medication as it may cause drowsiness.

## 2020-09-16 NOTE — ED Triage Notes (Signed)
Complains of constant dizziness since 5pm yesterday, reports two episodes of emesis. States she only had cheese crackers and water yesterday that she vomited, had water this morning and vomited as well. Denies recent falls or trauma. Issues w/ ambulation, feels off-balance.

## 2020-09-16 NOTE — ED Notes (Signed)
Patient ambulated to bathroom, states she doesn't feel as dizzy as before, will continue to monitor

## 2020-09-18 NOTE — Telephone Encounter (Signed)
See visit note on 09/08/20 Aileen Fass, RN

## 2020-09-20 DIAGNOSIS — G4733 Obstructive sleep apnea (adult) (pediatric): Secondary | ICD-10-CM | POA: Diagnosis not present

## 2020-09-21 DIAGNOSIS — I10 Essential (primary) hypertension: Secondary | ICD-10-CM | POA: Diagnosis not present

## 2020-09-21 DIAGNOSIS — R42 Dizziness and giddiness: Secondary | ICD-10-CM | POA: Diagnosis not present

## 2020-10-06 DIAGNOSIS — I10 Essential (primary) hypertension: Secondary | ICD-10-CM | POA: Diagnosis not present

## 2020-10-13 DIAGNOSIS — Z20822 Contact with and (suspected) exposure to covid-19: Secondary | ICD-10-CM | POA: Diagnosis not present

## 2020-10-15 DIAGNOSIS — Z0289 Encounter for other administrative examinations: Secondary | ICD-10-CM | POA: Diagnosis not present

## 2020-10-15 DIAGNOSIS — Z117 Encounter for testing for latent tuberculosis infection: Secondary | ICD-10-CM | POA: Diagnosis not present

## 2020-10-19 DIAGNOSIS — Z0289 Encounter for other administrative examinations: Secondary | ICD-10-CM | POA: Diagnosis not present

## 2020-10-19 DIAGNOSIS — Z111 Encounter for screening for respiratory tuberculosis: Secondary | ICD-10-CM | POA: Diagnosis not present

## 2020-10-19 DIAGNOSIS — Z117 Encounter for testing for latent tuberculosis infection: Secondary | ICD-10-CM | POA: Diagnosis not present

## 2020-10-21 DIAGNOSIS — G4733 Obstructive sleep apnea (adult) (pediatric): Secondary | ICD-10-CM | POA: Diagnosis not present

## 2020-10-25 DIAGNOSIS — M5136 Other intervertebral disc degeneration, lumbar region: Secondary | ICD-10-CM | POA: Diagnosis not present

## 2020-10-25 DIAGNOSIS — M47816 Spondylosis without myelopathy or radiculopathy, lumbar region: Secondary | ICD-10-CM | POA: Diagnosis not present

## 2020-10-25 DIAGNOSIS — M5137 Other intervertebral disc degeneration, lumbosacral region: Secondary | ICD-10-CM | POA: Diagnosis not present

## 2020-12-06 DIAGNOSIS — G4733 Obstructive sleep apnea (adult) (pediatric): Secondary | ICD-10-CM | POA: Diagnosis not present

## 2020-12-06 DIAGNOSIS — Z1231 Encounter for screening mammogram for malignant neoplasm of breast: Secondary | ICD-10-CM | POA: Diagnosis not present

## 2020-12-30 DIAGNOSIS — L820 Inflamed seborrheic keratosis: Secondary | ICD-10-CM | POA: Diagnosis not present

## 2020-12-30 DIAGNOSIS — L821 Other seborrheic keratosis: Secondary | ICD-10-CM | POA: Diagnosis not present

## 2020-12-30 DIAGNOSIS — L298 Other pruritus: Secondary | ICD-10-CM | POA: Diagnosis not present

## 2020-12-30 DIAGNOSIS — L538 Other specified erythematous conditions: Secondary | ICD-10-CM | POA: Diagnosis not present

## 2020-12-30 DIAGNOSIS — H53452 Other localized visual field defect, left eye: Secondary | ICD-10-CM | POA: Diagnosis not present

## 2021-02-03 DIAGNOSIS — Z1211 Encounter for screening for malignant neoplasm of colon: Secondary | ICD-10-CM | POA: Diagnosis not present

## 2021-02-03 DIAGNOSIS — R300 Dysuria: Secondary | ICD-10-CM | POA: Diagnosis not present

## 2021-02-03 DIAGNOSIS — R102 Pelvic and perineal pain: Secondary | ICD-10-CM | POA: Diagnosis not present

## 2021-02-12 ENCOUNTER — Encounter: Payer: Self-pay | Admitting: Emergency Medicine

## 2021-02-12 ENCOUNTER — Ambulatory Visit
Admission: EM | Admit: 2021-02-12 | Discharge: 2021-02-12 | Disposition: A | Discharge status: Home / Self Care | Payer: 59 | Attending: Family Medicine | Admitting: Family Medicine

## 2021-02-12 ENCOUNTER — Other Ambulatory Visit: Payer: Self-pay

## 2021-02-12 DIAGNOSIS — K5732 Diverticulitis of large intestine without perforation or abscess without bleeding: Secondary | ICD-10-CM

## 2021-02-12 DIAGNOSIS — R1032 Left lower quadrant pain: Secondary | ICD-10-CM

## 2021-02-12 MED ORDER — CIPROFLOXACIN HCL 500 MG PO TABS: 500.0000 mg | ORAL_TABLET | Freq: Two times a day (BID) | ORAL | 0 refills | Status: DC

## 2021-02-12 MED ORDER — METRONIDAZOLE 500 MG PO TABS: 500.0000 mg | ORAL_TABLET | Freq: Two times a day (BID) | ORAL | 0 refills | Status: AC

## 2021-02-12 NOTE — ED Triage Notes (Signed)
Pt has a hx of diverticulitis. She was seen by her PCP on 01/25/2021 but she was not treated. She has since tried self treatment that has not resolved her sxs.

## 2021-02-12 NOTE — ED Provider Notes (Signed)
[facility]    CSN: [id number] Arrival date & time: 02/12/21  1155      History   Chief Complaint Chief Complaint  Patient presents with   Diverticulitis    HPI Brandy Nelson is a 50 y.o. female.   HPI Patient with history of hypertension, obesity, and diverticular disease presents today with left lower quadrant abdominal pain. Reports she saw her PCP 9/15 for the same problem and was referred to GI provider for colonoscopy. She had been managing her symptoms with conservative measures without resolution of symptoms. Denies diarrhea or vomiting.  Reports regular stools. No fever.    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      Home Medications    Prior to Admission medications   Medication Sig Start Date End Date Taking? Authorizing Provider  ciprofloxacin (CIPRO) 500 MG tablet Take 1 tablet (500 mg total) by mouth 2 (two) times daily. 02/12/21  Yes Scot Jun, FNP  hydrochlorothiazide (HYDRODIURIL) 25 MG tablet Take 25 mg by mouth daily. 07/03/20  Yes [provider]  metroNIDAZOLE (FLAGYL) 500 MG tablet Take 1 tablet (500 mg total) by mouth 2 (two) times daily for 14 days. 02/12/21 02/26/21 Yes Scot Jun, FNP  meclizine (ANTIVERT) 12.5 MG tablet Take 1 tablet (12.5 mg total) by mouth 3 (three) times daily as needed for dizziness. 09/16/20   Sharion Balloon, NP  ondansetron (ZOFRAN ODT) 4 MG disintegrating tablet Take 1 tablet (4 mg total) by mouth every 8 (eight) hours as needed for nausea or vomiting. 09/16/20   Joy, Helane Gunther, PA-C    Family History Family History  Problem  Relation Age of Onset   Sarcoidosis Mother    Healthy Father     Social History Social History   Tobacco Use   Smoking status: Never   Smokeless tobacco: Never  Vaping Use   Vaping Use: Never used  Substance Use Topics   Alcohol use: Not Currently   Drug use: Not Currently     Allergies   Tramadol   Review of Systems Review of Systems Pertinent negatives listed in HPI  Physical Exam Triage Vital Signs ED Triage Vitals  Enc Vitals Group     BP 02/12/21 1205 139/81     Pulse Rate 02/12/21 1205 86     Resp 02/12/21 1205 18     Temp 02/12/21 1205 98.7 F (37.1 C)     Temp Source 02/12/21 1205 Oral     SpO2 02/12/21 1205 96 %     Weight --      Height --      Head Circumference --      Peak Flow --      Pain Score 02/12/21 1212 3     Pain Loc --      Pain Edu? --      Excl. in [place]? --    No data found.  Updated Vital Signs BP 139/81 (BP Location: Left Arm)   Pulse 86   Temp 98.7 F (37.1 C) (Oral)   Resp 18   LMP 01/25/2021  SpO2 96%   Visual Acuity Right Eye Distance:   Left Eye Distance:   Bilateral Distance:    Right Eye Near:   Left Eye Near:    Bilateral Near:     Physical Exam Constitutional:      General: She is not in acute distress.    Appearance: She is obese. She is not ill-appearing.  Cardiovascular:     Rate and Rhythm: Normal rate and regular rhythm.  Pulmonary:     Effort: Pulmonary effort is normal.     Breath sounds: Normal breath sounds.  Abdominal:     General: Bowel sounds are normal. There is distension.     Palpations: Abdomen is soft.     Tenderness: There is abdominal tenderness in the left lower quadrant.     Hernia: No hernia is present.  Neurological:     Mental Status: She is alert.  Psychiatric:        Attention and Perception: Attention normal.        Mood and Affect: Mood normal.        Speech: Speech normal.     UC Treatments / Results  Labs (all labs ordered are listed, but only abnormal results  are displayed) Labs Reviewed - No data to display  EKG   Radiology No results found.  Procedures Procedures (including critical care time)  Medications Ordered in UC Medications - No data to display  Initial Impression / Assessment and Plan / UC Course  I have reviewed the triage vital signs and the nursing notes.  Pertinent labs & imaging results that were available during my care of the patient were reviewed by me and considered in my medical decision making (see chart for details).    Treating for Diverticulitis based on patient symptoms and exam findings consistent with that of a diverticular flare. Advanced imaging unavailable therefore patient advised if her symptoms worsen at any point, to go to the emergency department and or follow-up with PCP for further evaluation. Final Clinical Impressions(s) / UC Diagnoses   Final diagnoses:  Abdominal pain, left lower quadrant  Diverticulitis of colon     Discharge Instructions      Follow-up with your primary care doctor if you have have not heard from GI doctor regarding scheduling of your colonoscopy.  Take all medication as prescribed.  If your abdominal pain worsens or doesn't improve with treatment follow-up with PCP or go immediately to the Emergency Department    ED Prescriptions     Medication Sig Dispense Auth. Provider   ciprofloxacin (CIPRO) 500 MG tablet Take 1 tablet (500 mg total) by mouth 2 (two) times daily. 10 tablet Scot Jun, FNP   metroNIDAZOLE (FLAGYL) 500 MG tablet Take 1 tablet (500 mg total) by mouth 2 (two) times daily for 14 days. 28 tablet Scot Jun, FNP      PDMP not reviewed this encounter.   Scot Jun, FNP 02/12/21 1252

## 2021-02-12 NOTE — Discharge Instructions (Addendum)
Follow-up with your primary care doctor if you have have not heard from GI doctor regarding scheduling of your colonoscopy.  Take all medication as prescribed.  If your abdominal pain worsens or doesn't improve with treatment follow-up with PCP or go immediately to the Emergency Department

## 2021-03-02 DIAGNOSIS — N926 Irregular menstruation, unspecified: Secondary | ICD-10-CM | POA: Diagnosis not present

## 2021-03-02 DIAGNOSIS — E669 Obesity, unspecified: Secondary | ICD-10-CM | POA: Diagnosis not present

## 2021-03-02 DIAGNOSIS — K5790 Diverticulosis of intestine, part unspecified, without perforation or abscess without bleeding: Secondary | ICD-10-CM | POA: Diagnosis not present

## 2021-03-02 DIAGNOSIS — I10 Essential (primary) hypertension: Secondary | ICD-10-CM | POA: Diagnosis not present

## 2021-03-08 DIAGNOSIS — G4733 Obstructive sleep apnea (adult) (pediatric): Secondary | ICD-10-CM | POA: Diagnosis not present

## 2021-04-04 DIAGNOSIS — I10 Essential (primary) hypertension: Secondary | ICD-10-CM | POA: Diagnosis not present

## 2021-04-04 DIAGNOSIS — R7303 Prediabetes: Secondary | ICD-10-CM | POA: Diagnosis not present

## 2021-04-04 DIAGNOSIS — E785 Hyperlipidemia, unspecified: Secondary | ICD-10-CM | POA: Diagnosis not present

## 2021-04-04 DIAGNOSIS — E559 Vitamin D deficiency, unspecified: Secondary | ICD-10-CM | POA: Diagnosis not present

## 2021-04-04 DIAGNOSIS — E039 Hypothyroidism, unspecified: Secondary | ICD-10-CM | POA: Diagnosis not present

## 2021-04-04 DIAGNOSIS — E7800 Pure hypercholesterolemia, unspecified: Secondary | ICD-10-CM | POA: Diagnosis not present

## 2021-04-04 DIAGNOSIS — R319 Hematuria, unspecified: Secondary | ICD-10-CM | POA: Diagnosis not present

## 2021-04-04 DIAGNOSIS — R5383 Other fatigue: Secondary | ICD-10-CM | POA: Diagnosis not present

## 2021-04-13 DIAGNOSIS — N760 Acute vaginitis: Secondary | ICD-10-CM | POA: Diagnosis not present

## 2021-04-13 DIAGNOSIS — Z01419 Encounter for gynecological examination (general) (routine) without abnormal findings: Secondary | ICD-10-CM | POA: Diagnosis not present

## 2021-04-13 DIAGNOSIS — Z0142 Encounter for cervical smear to confirm findings of recent normal smear following initial abnormal smear: Secondary | ICD-10-CM | POA: Diagnosis not present

## 2021-04-13 DIAGNOSIS — Z0000 Encounter for general adult medical examination without abnormal findings: Secondary | ICD-10-CM | POA: Diagnosis not present

## 2021-04-13 DIAGNOSIS — Z124 Encounter for screening for malignant neoplasm of cervix: Secondary | ICD-10-CM | POA: Diagnosis not present

## 2021-04-13 DIAGNOSIS — I10 Essential (primary) hypertension: Secondary | ICD-10-CM | POA: Diagnosis not present

## 2021-04-13 DIAGNOSIS — Z113 Encounter for screening for infections with a predominantly sexual mode of transmission: Secondary | ICD-10-CM | POA: Diagnosis not present

## 2021-04-13 DIAGNOSIS — Z1151 Encounter for screening for human papillomavirus (HPV): Secondary | ICD-10-CM | POA: Diagnosis not present

## 2021-05-19 DIAGNOSIS — R42 Dizziness and giddiness: Secondary | ICD-10-CM | POA: Diagnosis not present

## 2021-05-19 DIAGNOSIS — H8110 Benign paroxysmal vertigo, unspecified ear: Secondary | ICD-10-CM | POA: Diagnosis not present

## 2021-05-19 DIAGNOSIS — I10 Essential (primary) hypertension: Secondary | ICD-10-CM | POA: Diagnosis not present

## 2021-05-31 DIAGNOSIS — G608 Other hereditary and idiopathic neuropathies: Secondary | ICD-10-CM | POA: Diagnosis not present

## 2021-05-31 DIAGNOSIS — M79661 Pain in right lower leg: Secondary | ICD-10-CM | POA: Diagnosis not present

## 2021-05-31 DIAGNOSIS — R002 Palpitations: Secondary | ICD-10-CM | POA: Diagnosis not present

## 2021-05-31 DIAGNOSIS — I10 Essential (primary) hypertension: Secondary | ICD-10-CM | POA: Diagnosis not present

## 2021-05-31 DIAGNOSIS — G603 Idiopathic progressive neuropathy: Secondary | ICD-10-CM | POA: Diagnosis not present

## 2021-05-31 DIAGNOSIS — D519 Vitamin B12 deficiency anemia, unspecified: Secondary | ICD-10-CM | POA: Diagnosis not present

## 2021-05-31 DIAGNOSIS — D509 Iron deficiency anemia, unspecified: Secondary | ICD-10-CM | POA: Diagnosis not present

## 2021-05-31 DIAGNOSIS — R42 Dizziness and giddiness: Secondary | ICD-10-CM | POA: Diagnosis not present

## 2021-06-06 DIAGNOSIS — G4733 Obstructive sleep apnea (adult) (pediatric): Secondary | ICD-10-CM | POA: Diagnosis not present

## 2021-06-07 DIAGNOSIS — I6523 Occlusion and stenosis of bilateral carotid arteries: Secondary | ICD-10-CM | POA: Diagnosis not present

## 2021-06-07 DIAGNOSIS — I517 Cardiomegaly: Secondary | ICD-10-CM | POA: Diagnosis not present

## 2021-06-07 DIAGNOSIS — R002 Palpitations: Secondary | ICD-10-CM | POA: Diagnosis not present

## 2021-06-07 DIAGNOSIS — R42 Dizziness and giddiness: Secondary | ICD-10-CM | POA: Diagnosis not present

## 2021-06-07 DIAGNOSIS — I10 Essential (primary) hypertension: Secondary | ICD-10-CM | POA: Diagnosis not present

## 2021-06-29 DIAGNOSIS — K5790 Diverticulosis of intestine, part unspecified, without perforation or abscess without bleeding: Secondary | ICD-10-CM | POA: Diagnosis not present

## 2021-06-29 DIAGNOSIS — Z1211 Encounter for screening for malignant neoplasm of colon: Secondary | ICD-10-CM | POA: Diagnosis not present

## 2021-07-07 DIAGNOSIS — R21 Rash and other nonspecific skin eruption: Secondary | ICD-10-CM | POA: Diagnosis not present

## 2021-07-07 DIAGNOSIS — I10 Essential (primary) hypertension: Secondary | ICD-10-CM | POA: Diagnosis not present

## 2021-07-07 DIAGNOSIS — L299 Pruritus, unspecified: Secondary | ICD-10-CM | POA: Diagnosis not present

## 2021-07-07 DIAGNOSIS — L259 Unspecified contact dermatitis, unspecified cause: Secondary | ICD-10-CM | POA: Diagnosis not present

## 2021-09-05 DIAGNOSIS — G4733 Obstructive sleep apnea (adult) (pediatric): Secondary | ICD-10-CM | POA: Diagnosis not present

## 2021-09-10 IMAGING — CT CT HEAD W/O CM
3 series · 15 of 46 positions shown, 18 images · non-contrast
Comparison: None.

CLINICAL DATA: Dizziness starting at 5 p.m. yesterday.

EXAM:
CT HEAD WITHOUT CONTRAST
TECHNIQUE: Contiguous axial images were obtained from the base of the skull
through the vertex without intravenous contrast.

[Series 2: head wo · axial · 0.47mm/px · z∈[-123,-3]mm · 9 of 29 slices shown, 12 images]
[im 3/29  brain]
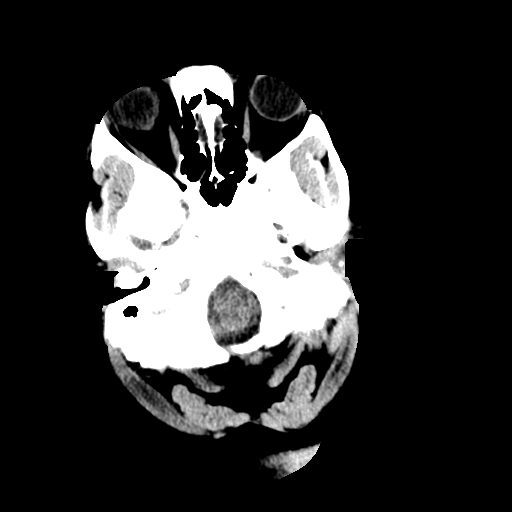
[im 3/29  bone]
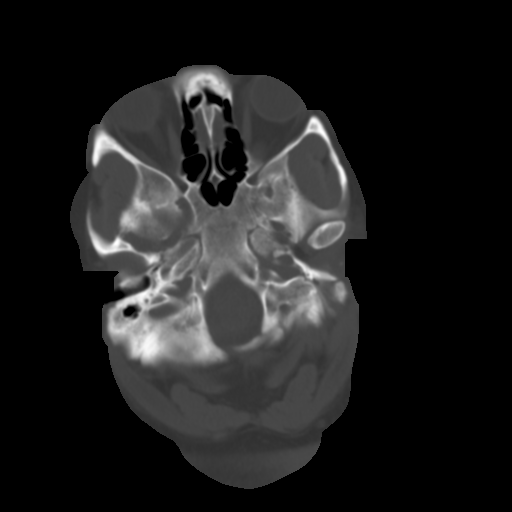
[im 6/29  brain]
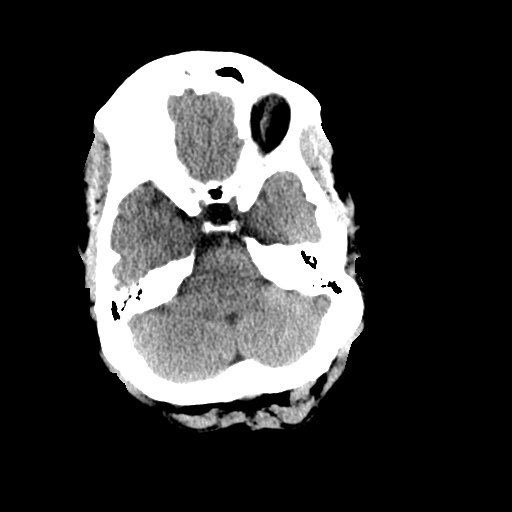
[im 9/29  brain]
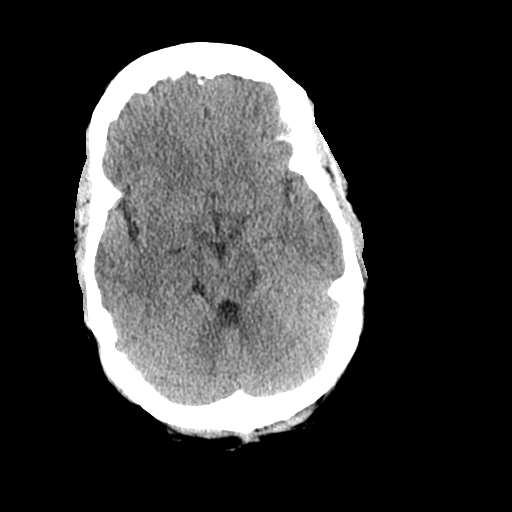
[im 12/29  brain]
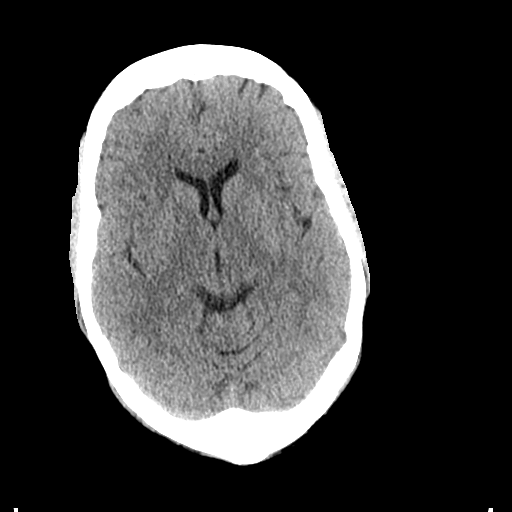
[im 15/29  brain]
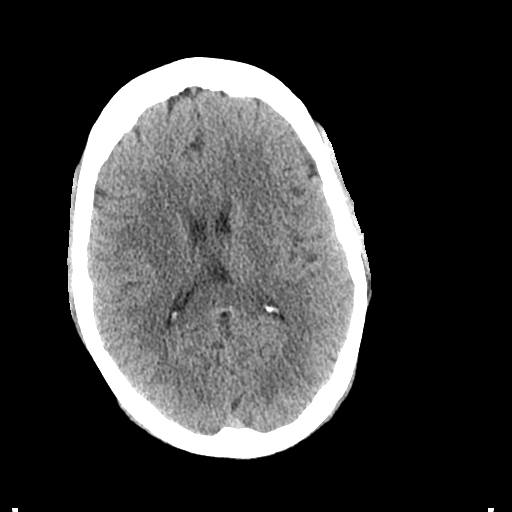
[im 15/29  bone]
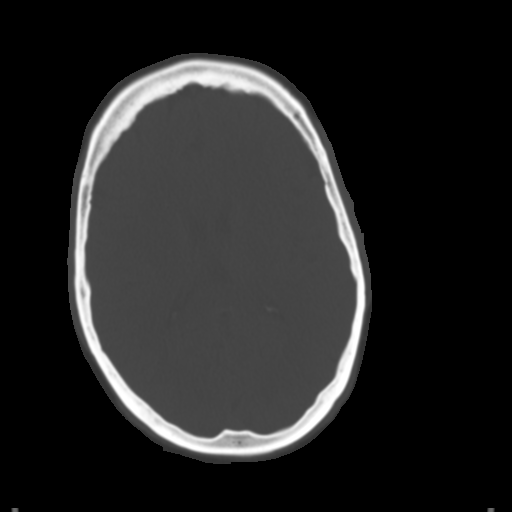
[im 18/29  brain]
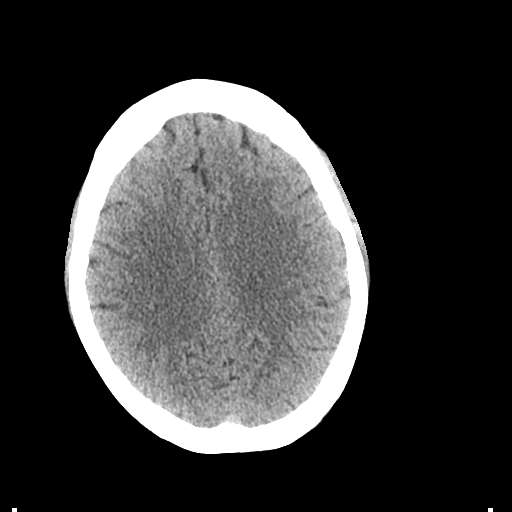
[im 21/29  brain]
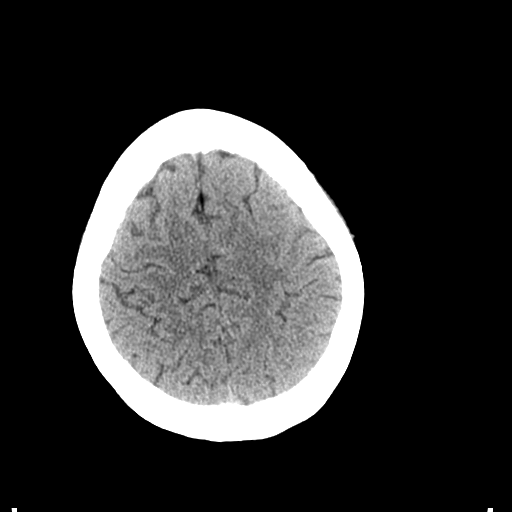
[im 24/29  brain]
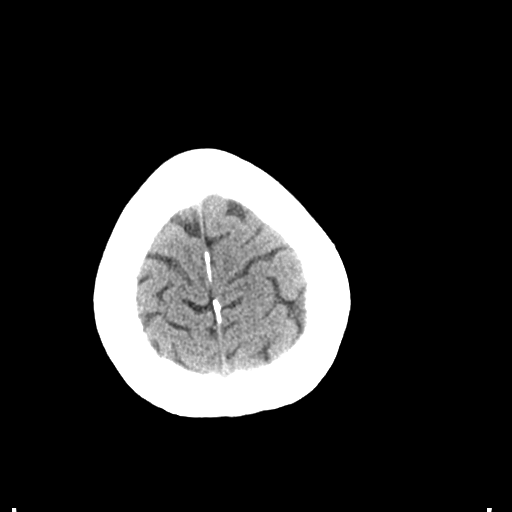
[im 27/29  brain]
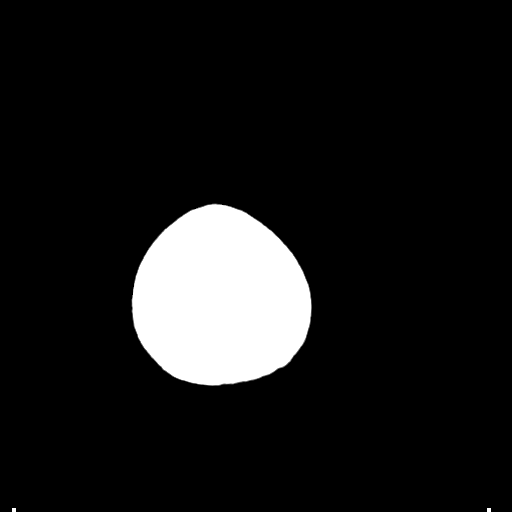
[im 27/29  bone]
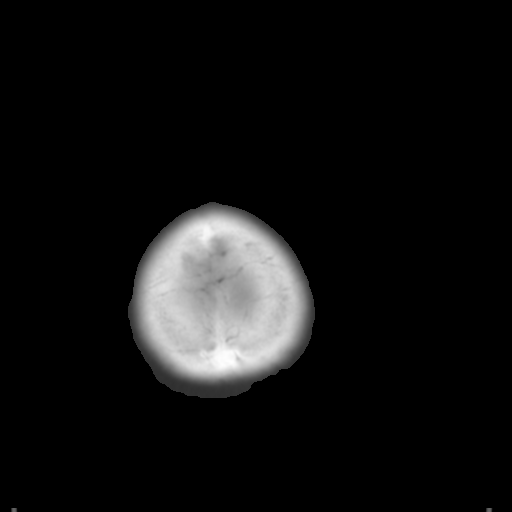

[Series 4: coronal soft tissue · coronal · 0.37mm/px · 3 of 69 slices shown]
[im 23/69  brain]
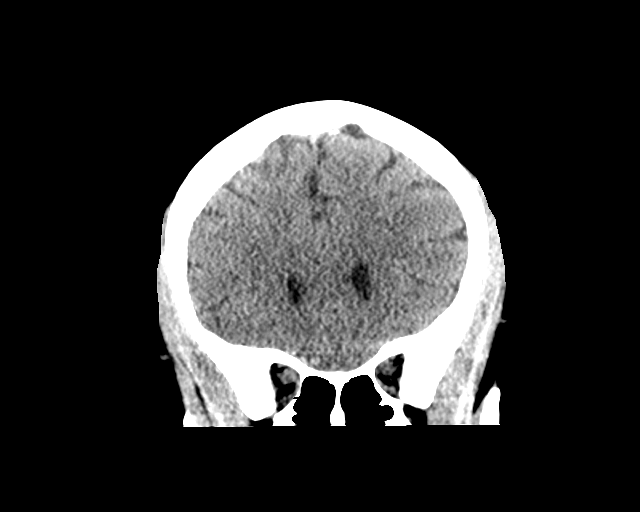
[im 31/69  brain]
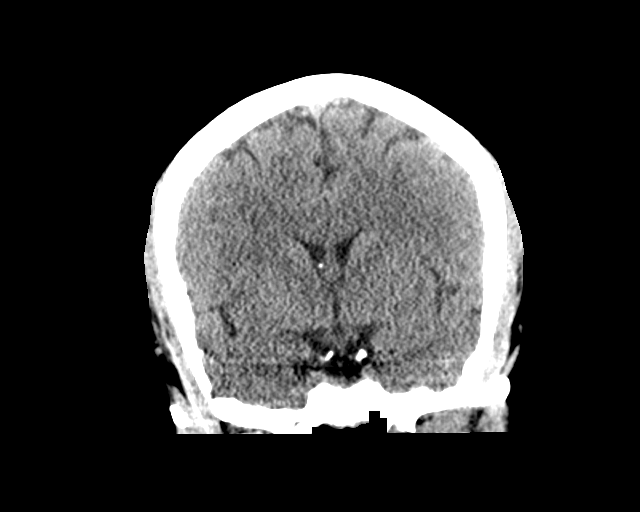
[im 38/69  brain]
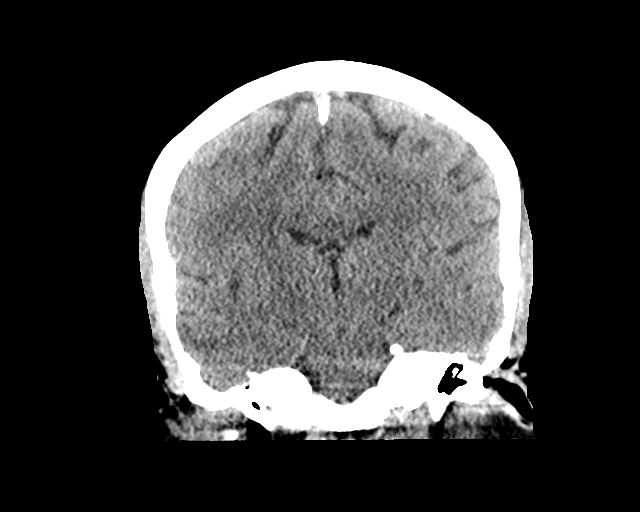

[Series 5: sagittal soft tissue · sagittal · 0.38mm/px · 3 of 50 slices shown]
[im 17/50  brain]
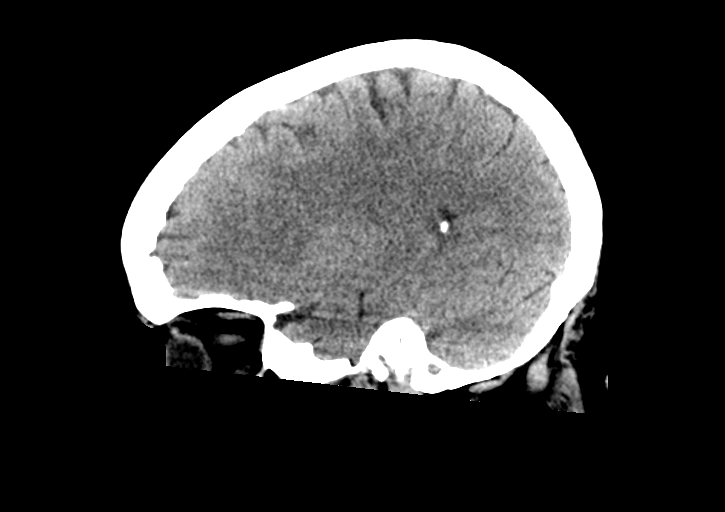
[im 25/50  brain]
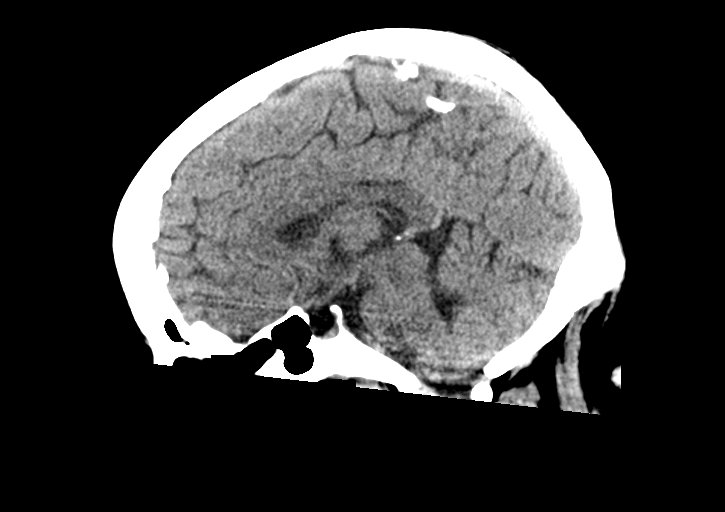
[im 33/50  brain]
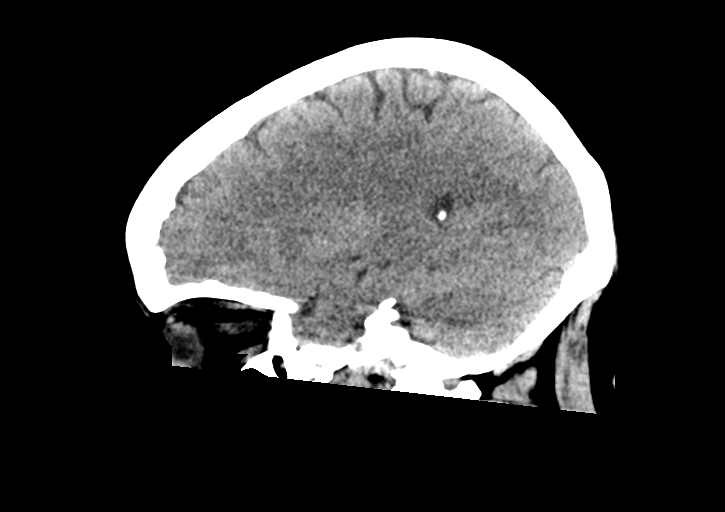

[15 of 46 positions shown; findings below may reference images not displayed]

FINDINGS: Brain: The brainstem, cerebellum, cerebral peduncles, thalami, basal
ganglia, basilar cisterns, and ventricular system appear within
normal limits. No intracranial hemorrhage, mass lesion, or acute
CVA.

Vascular: Unremarkable

Skull: Unremarkable

Sinuses/Orbits: Unremarkable

Other: No supplemental non-categorized findings.
IMPRESSION: No significant abnormality is identified to account for the
patient's current symptoms.

## 2021-09-21 ENCOUNTER — Encounter: Payer: Self-pay | Admitting: Gastroenterology

## 2021-09-21 NOTE — Anesthesia Preprocedure Evaluation (Addendum)
Anesthesia Evaluation  ?Patient identified by MRN, date of birth, ID band ?Patient awake ? ? ? ?Reviewed: ?Allergy & Precautions, NPO status , Patient's Chart, lab work & pertinent test results ? ?History of Anesthesia Complications ?Negative for: history of anesthetic complications ? ?Airway ?Mallampati: III ? ? ?Neck ROM: Full ? ? ? Dental ?no notable dental hx. ? ?  ?Pulmonary ?sleep apnea ,  ?  ?Pulmonary exam normal ?breath sounds clear to auscultation ? ? ? ? ? ? Cardiovascular ?hypertension, Normal cardiovascular exam ?Rhythm:Regular Rate:Normal ? ? ?  ?Neuro/Psych ?negative neurological ROS ?   ? GI/Hepatic ?GERD  ,  ?Endo/Other  ?Obesity  ? Renal/GU ?negative Renal ROS  ? ?  ?Musculoskeletal ? ?(+) Arthritis ,  ? Abdominal ?  ?Peds ? Hematology ?negative hematology ROS ?(+)   ?Anesthesia Other Findings ? ? Reproductive/Obstetrics ? ?  ? ? ? ? ? ? ? ? ? ? ? ? ? ?  ?  ? ? ? ? ? ? ? ?Anesthesia Physical ?Anesthesia Plan ? ?ASA: 2 ? ?Anesthesia Plan: General  ? ?Post-op Pain Management:   ? ?Induction: Intravenous ? ?PONV Risk Score and Plan: 3 and Propofol infusion, TIVA and Treatment may vary due to age or medical condition ? ?Airway Management Planned: Natural Airway ? ?Additional Equipment:  ? ?Intra-op Plan:  ? ?Post-operative Plan:  ? ?Informed Consent: I have reviewed the patients History and Physical, chart, labs and discussed the procedure including the risks, benefits and alternatives for the proposed anesthesia with the patient or authorized representative who has indicated his/her understanding and acceptance.  ? ? ? ? ? ?Plan Discussed with: CRNA ? ?Anesthesia Plan Comments: (LMA/GETA backup discussed.  Patient consented for risks of anesthesia including but not limited to:  ?- adverse reactions to medications ?- damage to eyes, teeth, lips or other oral mucosa ?- nerve damage due to positioning  ?- sore throat or hoarseness ?- damage to heart, brain, nerves,  lungs, other parts of body or loss of life ? ?Informed patient about role of CRNA in peri- and intra-operative care.  Patient voiced understanding.)  ? ? ? ? ? ? ?Anesthesia Quick Evaluation ? ?

## 2021-09-22 ENCOUNTER — Ambulatory Visit
Admission: RE | Admit: 2021-09-22 | Discharge: 2021-09-22 | Disposition: A | Discharge status: Home / Self Care | Payer: 59 | Attending: Gastroenterology | Admitting: Gastroenterology

## 2021-09-22 ENCOUNTER — Ambulatory Visit: Payer: 59 | Admitting: Anesthesiology

## 2021-09-22 ENCOUNTER — Encounter: Payer: Self-pay | Admitting: Gastroenterology

## 2021-09-22 ENCOUNTER — Encounter
Admission: RE | Disposition: A | Discharge status: Home / Self Care | Payer: Self-pay | Source: Home / Self Care | Attending: Gastroenterology

## 2021-09-22 DIAGNOSIS — K219 Gastro-esophageal reflux disease without esophagitis: Secondary | ICD-10-CM | POA: Insufficient documentation

## 2021-09-22 DIAGNOSIS — E669 Obesity, unspecified: Secondary | ICD-10-CM | POA: Insufficient documentation

## 2021-09-22 DIAGNOSIS — G4730 Sleep apnea, unspecified: Secondary | ICD-10-CM | POA: Insufficient documentation

## 2021-09-22 DIAGNOSIS — D123 Benign neoplasm of transverse colon: Secondary | ICD-10-CM | POA: Diagnosis not present

## 2021-09-22 DIAGNOSIS — K640 First degree hemorrhoids: Secondary | ICD-10-CM | POA: Diagnosis not present

## 2021-09-22 DIAGNOSIS — Z6841 Body Mass Index (BMI) 40.0 and over, adult: Secondary | ICD-10-CM | POA: Diagnosis not present

## 2021-09-22 DIAGNOSIS — Z09 Encounter for follow-up examination after completed treatment for conditions other than malignant neoplasm: Secondary | ICD-10-CM | POA: Diagnosis present

## 2021-09-22 DIAGNOSIS — K635 Polyp of colon: Secondary | ICD-10-CM | POA: Diagnosis not present

## 2021-09-22 DIAGNOSIS — Z79899 Other long term (current) drug therapy: Secondary | ICD-10-CM | POA: Diagnosis not present

## 2021-09-22 DIAGNOSIS — D122 Benign neoplasm of ascending colon: Secondary | ICD-10-CM | POA: Diagnosis not present

## 2021-09-22 DIAGNOSIS — M1990 Unspecified osteoarthritis, unspecified site: Secondary | ICD-10-CM | POA: Diagnosis not present

## 2021-09-22 DIAGNOSIS — I10 Essential (primary) hypertension: Secondary | ICD-10-CM | POA: Insufficient documentation

## 2021-09-22 DIAGNOSIS — K5730 Diverticulosis of large intestine without perforation or abscess without bleeding: Secondary | ICD-10-CM | POA: Diagnosis not present

## 2021-09-22 DIAGNOSIS — Z1211 Encounter for screening for malignant neoplasm of colon: Secondary | ICD-10-CM | POA: Diagnosis not present

## 2021-09-22 DIAGNOSIS — K649 Unspecified hemorrhoids: Secondary | ICD-10-CM | POA: Diagnosis not present

## 2021-09-22 HISTORY — DX: Unspecified osteoarthritis, unspecified site: M19.90

## 2021-09-22 HISTORY — PX: COLONOSCOPY WITH PROPOFOL: SHX5780

## 2021-09-22 HISTORY — DX: Gastro-esophageal reflux disease without esophagitis: K21.9

## 2021-09-22 HISTORY — DX: Sleep apnea, unspecified: G47.30

## 2021-09-22 LAB — POCT PREGNANCY, URINE: Preg Test, Ur: NEGATIVE

## 2021-09-22 SURGERY — COLONOSCOPY WITH PROPOFOL
Anesthesia: General

## 2021-09-22 MED ORDER — PROPOFOL 10 MG/ML IV BOLUS
INTRAVENOUS | Status: DC | PRN
  Administered 2021-09-22: 70 mg via INTRAVENOUS

## 2021-09-22 MED ORDER — SODIUM CHLORIDE 0.9 % IV SOLN: INTRAVENOUS | Status: DC

## 2021-09-22 MED ORDER — LIDOCAINE HCL (CARDIAC) PF 100 MG/5ML IV SOSY
PREFILLED_SYRINGE | INTRAVENOUS | Status: DC | PRN
  Administered 2021-09-22: 100 mg via INTRAVENOUS

## 2021-09-22 MED ORDER — PROPOFOL 500 MG/50ML IV EMUL
INTRAVENOUS | Status: DC | PRN
  Administered 2021-09-22: 140 ug/kg/min via INTRAVENOUS

## 2021-09-22 NOTE — Anesthesia Postprocedure Evaluation (Signed)
Anesthesia Post Note ? ?Patient: Brandy Nelson ? ?Procedure(s) Performed: COLONOSCOPY WITH PROPOFOL ? ?Patient location during evaluation: PACU ?Anesthesia Type: General ?Level of consciousness: awake and alert, oriented and patient cooperative ?Pain management: pain level controlled ?Vital Signs Assessment: post-procedure vital signs reviewed and stable ?Respiratory status: spontaneous breathing, nonlabored ventilation and respiratory function stable ?Cardiovascular status: blood pressure returned to baseline and stable ?Postop Assessment: adequate PO intake ?Anesthetic complications: no ? ? ?No notable events documented. ? ? ?Last Vitals:  ?Vitals:  ? 09/22/21 1020 09/22/21 1030  ?BP: 98/68 (!) 112/53  ?Pulse: 73 71  ?Resp: 19 (!) 25  ?Temp:    ?SpO2: 100% 100%  ?  ?Last Pain:  ?Vitals:  ? 09/22/21 1030  ?TempSrc:   ?PainSc: 0-No pain  ? ? ?  ?  ?  ?  ?  ?  ? ?Darrin Nipper ? ? ? ? ?

## 2021-09-22 NOTE — H&P (Signed)
? ?Pre-Procedure H&P ?  ?Patient ID: Brandy Nelson is a 51 y.o. female. ? ?Gastroenterology Provider: [name], DO ? ?Referring Provider: Laurine Blazer, PA ?PCP: Remi Haggard, FNP ? ?Date: 09/22/2021 ? ?HPI ?Brandy Nelson is a 51 y.o. female who presents today for Colonoscopy for Diverticulitis follow-up. ?Patient experienced repeat diverticulitis episode approximately in September/October, December 2022 and Early March 2023.  At that time it was accompanied by abdominal pain and diarrhea.  She received ciprofloxacin and Flagyl with resolution each time.   ?Bowel movements have returned to normal with no melena or hematochezia.  No further abdominal pain constipation or diarrhea. ? ?Undergoing follow-up colonoscopy.  She has never previously undergone screening colonoscopy. ?No family history of colon cancer or colon polyps ?Status postcholecystectomy and C-section ?Most recent lab work hemoglobin 11.7 (baseline) MCV 86 platelets 227,000 creatinine 0.9 ? ?Past Medical History:  ?Diagnosis Date  ? Arthritis   ? GERD (gastroesophageal reflux disease)   ? Hypertension   ? Obesity   ? Osteoarthritis   ? knee  ? Sleep apnea   ? ? ?Past Surgical History:  ?Procedure Laterality Date  ? CESAREAN SECTION  10/22/1999  ? 12/21/96  ? CHOLECYSTECTOMY  2001  ? TENDON REPAIR  10/15/2019  ? Quadriceps muscle rupture  ? ? ?Family History ?No h/o GI disease or malignancy ? ?Review of Systems  ?Constitutional:  Negative for activity change, appetite change, chills, diaphoresis, fatigue, fever and unexpected weight change.  ?HENT:  Negative for trouble swallowing and voice change.   ?Respiratory:  Negative for shortness of breath and wheezing.   ?Cardiovascular:  Negative for chest pain, palpitations and leg swelling.  ?Gastrointestinal:  Negative for abdominal distention, abdominal pain, anal bleeding, blood in stool, constipation, diarrhea, nausea, rectal pain and vomiting.  ?Musculoskeletal:  Negative for  arthralgias and myalgias.  ?Skin:  Negative for color change and pallor.  ?Neurological:  Negative for dizziness, syncope and weakness.  ?Psychiatric/Behavioral:  Negative for confusion.   ?All other systems reviewed and are negative.  ? ?Medications ?No current facility-administered medications on file prior to encounter.  ? ?Current Outpatient Medications on File Prior to Encounter  ?Medication Sig Dispense Refill  ? IBUPROFEN PO Take by mouth 2 (two) times daily.    ? NUTRITIONAL SUPPLEMENTS PO Take by mouth.    ? ciprofloxacin (CIPRO) 500 MG tablet Take 1 tablet (500 mg total) by mouth 2 (two) times daily. (Patient not taking: Reported on 09/22/2021) 10 tablet 0  ? hydrochlorothiazide (HYDRODIURIL) 25 MG tablet Take 25 mg by mouth daily.    ? meclizine (ANTIVERT) 12.5 MG tablet Take 1 tablet (12.5 mg total) by mouth 3 (three) times daily as needed for dizziness. 30 tablet 0  ? ondansetron (ZOFRAN ODT) 4 MG disintegrating tablet Take 1 tablet (4 mg total) by mouth every 8 (eight) hours as needed for nausea or vomiting. 20 tablet 0  ? ? ?Pertinent medications related to GI and procedure were reviewed by me with the patient prior to the procedure ? ? ?Current Facility-Administered Medications:  ?  0.9 %  sodium chloride infusion, , Intravenous, Continuous, [name], DO ?  ?  ? ?Allergies  ?Allergen Reactions  ? Tramadol   ? ?Allergies were reviewed by me prior to the procedure ? ?Objective  ? ?Body mass index is 42.07 kg/m?. ?Vitals:  ? 09/22/21 0755  ?BP: (!) 143/79  ?Pulse: 93  ?Resp: 17  ?Temp: (!) 97.4 ?F (36.3 ?C)  ?TempSrc:  Temporal  ?SpO2: 99%  ?Weight: 104.3 kg  ?Height: '5\' 2"'$  (1.575 m)  ? ? ? ?Physical Exam ?Vitals and nursing note reviewed.  ?Constitutional:   ?   General: She is not in acute distress. ?   Appearance: Normal appearance. She is obese. She is not ill-appearing, toxic-appearing or diaphoretic.  ?HENT:  ?   Head: Normocephalic and atraumatic.  ?   Nose: Nose normal.  ?    Mouth/Throat:  ?   Mouth: Mucous membranes are moist.  ?   Pharynx: Oropharynx is clear.  ?Eyes:  ?   General: No scleral icterus. ?   Extraocular Movements: Extraocular movements intact.  ?Cardiovascular:  ?   Rate and Rhythm: Normal rate and regular rhythm.  ?   Heart sounds: Normal heart sounds. No murmur heard. ?  No friction rub. No gallop.  ?Pulmonary:  ?   Effort: Pulmonary effort is normal. No respiratory distress.  ?   Breath sounds: Normal breath sounds. No wheezing, rhonchi or rales.  ?Abdominal:  ?   General: Bowel sounds are normal. There is no distension.  ?   Palpations: Abdomen is soft.  ?   Tenderness: There is no abdominal tenderness. There is no guarding or rebound.  ?Musculoskeletal:  ?   Cervical back: Neck supple.  ?   Right lower leg: No edema.  ?   Left lower leg: No edema.  ?Skin: ?   General: Skin is warm and dry.  ?   Coloration: Skin is not jaundiced or pale.  ?Neurological:  ?   General: No focal deficit present.  ?   Mental Status: She is alert and oriented to person, place, and time. Mental status is at baseline.  ?Psychiatric:     ?   Mood and Affect: Mood normal.     ?   Behavior: Behavior normal.     ?   Thought Content: Thought content normal.     ?   Judgment: Judgment normal.  ? ? ? ?Assessment:  ?Ms. Brandy Nelson is a 51 y.o. female  who presents today for Colonoscopy for Diverticulitis follow-up. ? ?Plan:  ?Colonoscopy with possible intervention today ? ?Colonoscopy with possible biopsy, control of bleeding, polypectomy, and interventions as necessary has been discussed with the patient/patient representative. Informed consent was obtained from the patient/patient representative after explaining the indication, nature, and risks of the procedure including but not limited to death, bleeding, perforation, missed neoplasm/lesions, cardiorespiratory compromise, and reaction to medications. Opportunity for questions was given and appropriate answers were provided. Patient/patient  representative has verbalized understanding is amenable to undergoing the procedure. ? ? ?[name], DO  ?[facility] ? ?Portions of the record may have been created with voice recognition software. Occasional wrong-word or 'sound-a-like' substitutions may have occurred due to the inherent limitations of voice recognition software.  Read the chart carefully and recognize, using context, where substitutions may have occurred. ?

## 2021-09-22 NOTE — Transfer of Care (Signed)
Immediate Anesthesia Transfer of Care Note ? ?Patient: Brandy Nelson ? ?Procedure(s) Performed: COLONOSCOPY WITH PROPOFOL ? ?Patient Location: PACU ? ?Anesthesia Type:General ? ?Level of Consciousness: awake, alert  and oriented ? ?Airway & Oxygen Therapy: Patient Spontanous Breathing ? ?Post-op Assessment: Report given to RN and Post -op Vital signs reviewed and stable ? ?Post vital signs: Reviewed and stable ? ?Last Vitals:  ?Vitals Value Taken Time  ?BP    ?Temp 36.4 ?C 09/22/21 1000  ?Pulse 93 09/22/21 1000  ?Resp 31 09/22/21 1000  ?SpO2 100 % 09/22/21 1000  ? ? ?Last Pain:  ?Vitals:  ? 09/22/21 1000  ?TempSrc: Temporal  ?PainSc: 0-No pain  ?   ? ?  ? ?Complications: No notable events documented. ?

## 2021-09-22 NOTE — Interval H&P Note (Signed)
History and Physical Interval Note: Preprocedure H&P from 09/22/21 ? was reviewed and there was no interval change after seeing and examining the patient.  Written consent was obtained from the patient after discussion of risks, benefits, and alternatives. Patient has consented to proceed with Colonoscopy with possible intervention ? ? ?09/22/2021 ?9:29 AM ? ?Brandy Nelson  has presented today for surgery, with the diagnosis of Z12.11  - Colon cancer screening.  The various methods of treatment have been discussed with the patient and family. After consideration of risks, benefits and other options for treatment, the patient has consented to  Procedure(s): ?COLONOSCOPY WITH PROPOFOL (N/A) as a surgical intervention.  The patient's history has been reviewed, patient examined, no change in status, stable for surgery.  I have reviewed the patient's chart and labs.  Questions were answered to the patient's satisfaction.   ? ? ?[name] ? ? ?

## 2021-09-22 NOTE — Op Note (Signed)
[facility] ?Gastroenterology ?Patient Name: Brandy Nelson ?Procedure Date: 09/22/2021 9:18 AM ?MRN: [id number] ?Account #: [id number] ?Date of Birth: 10-Oct-1970 ?Admit Type: Outpatient ?Age: 51 ?Room: [facility] ENDO ROOM 1 ?Gender: Female ?Note Status: Finalized ?Instrument Name: Colonscope [id number] ?Procedure:             Colonoscopy ?Indications:           Follow-up of diverticulitis ?Providers:             [name] DO, DO ?Referring MD:          Jordan Likes. Lavena Bullion (Referring MD) ?Medicines:             Monitored Anesthesia Care ?Complications:         No immediate complications. Estimated blood loss:  ?                       Minimal. ?Procedure:             Pre-Anesthesia Assessment: ?                       - Prior to the procedure, a History and Physical was  ?                       performed, and patient medications and allergies were  ?                       reviewed. The patient is competent. The risks and  ?                       benefits of the procedure and the sedation options and  ?                       risks were discussed with the patient. All questions  ?                       were answered and informed consent was obtained.  ?                       Patient identification and proposed procedure were  ?                       verified by the physician, the nurse, the anesthetist  ?                       and the technician in the endoscopy suite. Mental  ?                       Status Examination: alert and oriented. Airway  ?                       Examination: normal oropharyngeal airway and neck  ?                       mobility. Respiratory Examination: clear to  ?                       auscultation. CV Examination: RRR, no murmurs, no S3  ?  or S4. Prophylactic Antibiotics: The patient does not  ?                       require prophylactic antibiotics. Prior  ?                       Anticoagulants: The patient has taken no previous  ?                        anticoagulant or antiplatelet agents. ASA Grade  ?                       Assessment: II - A patient with mild systemic disease.  ?                       After reviewing the risks and benefits, the patient  ?                       was deemed in satisfactory condition to undergo the  ?                       procedure. The anesthesia plan was to use monitored  ?                       anesthesia care (MAC). Immediately prior to  ?                       administration of medications, the patient was  ?                       re-assessed for adequacy to receive sedatives. The  ?                       heart rate, respiratory rate, oxygen saturations,  ?                       blood pressure, adequacy of pulmonary ventilation, and  ?                       response to care were monitored throughout the  ?                       procedure. The physical status of the patient was  ?                       re-assessed after the procedure. ?                       After obtaining informed consent, the colonoscope was  ?                       passed under direct vision. Throughout the procedure,  ?                       the patient's blood pressure, pulse, and oxygen  ?                       saturations were monitored continuously. The  ?  Colonoscope was introduced through the anus and  ?                       advanced to the the cecum, identified by appendiceal  ?                       orifice and ileocecal valve. The colonoscopy was  ?                       performed without difficulty. The patient tolerated  ?                       the procedure well. The quality of the bowel  ?                       preparation was evaluated using the BBPS [facility] Bowel  ?                       Preparation Scale) with scores of: Right Colon = 3,  ?                       Transverse Colon = 3 and Left Colon = 3 (entire mucosa  ?                       seen well with no residual staining, small fragments  ?                        of stool or opaque liquid). The total BBPS score  ?                       equals 9. ?Findings: ?     The perianal and digital rectal examinations were normal. Pertinent  ?     negatives include normal sphincter tone. ?     A 1 to 2 mm polyp was found in the hepatic flexure. The polyp was  ?     sessile. The polyp was removed with a cold biopsy forceps. Resection and  ?     retrieval were complete. Estimated blood loss was minimal. ?     Multiple small-mouthed diverticula were found in the entire colon.  ?     Multiple on the left colon. Few and scattered throughout the rest of the  ?     proximal colon. Estimated blood loss: none. ?     Non-bleeding internal hemorrhoids were found during retroflexion. The  ?     hemorrhoids were Grade I (internal hemorrhoids that do not prolapse). ?     The exam was otherwise without abnormality on direct and retroflexion  ?     views. ?Impression:            - One 1 to 2 mm polyp at the hepatic flexure, removed  ?                       with a cold biopsy forceps. Resected and retrieved. ?                       - Diverticulosis in the entire examined colon. ?                       -  Non-bleeding internal hemorrhoids. ?                       - The examination was otherwise normal on direct and  ?                       retroflexion views. ?Recommendation:        - Discharge patient to home. ?                       - Resume previous diet. ?                       - Continue present medications. ?                       - Await pathology results. ?                       - Repeat colonoscopy for surveillance based on  ?                       pathology results. ?                       - Return to GI office as previously scheduled. ?                       - The findings and recommendations were discussed with  ?                       the patient. ?Procedure Code(s):     --- Professional --- ?                       [phone], Colonoscopy, flexible; with biopsy, single or  ?                        multiple ?Diagnosis Code(s):     --- Professional --- ?                       K63.5, Polyp of colon ?                       K64.0, First degree hemorrhoids ?                       K57.32, Diverticulitis of large intestine without  ?                       perforation or abscess without bleeding ?                       K57.30, Diverticulosis of large intestine without  ?                       perforation or abscess without bleeding ?CPT copyright 2019 American Medical Association. All rights reserved. ?The codes documented in this report are preliminary and upon coder review may  ?be revised to meet current compliance requirements. ?Attending Participation: ?     I personally performed the entire procedure. ?Volney American, DO ?[place]rick, DO ?09/22/2021 10:01:33 AM ?This report has  been signed electronically. ?Number of Addenda: 0 ?Note Initiated On: 09/22/2021 9:18 AM ?Scope Withdrawal Time: 0 hours 17 minutes 42 seconds  ?Total Procedure Duration: 0 hours 21 minutes 5 seconds  ?Estimated Blood Loss:  Estimated blood loss was minimal. ?     [facility] ?

## 2021-09-23 ENCOUNTER — Encounter: Payer: Self-pay | Admitting: Gastroenterology

## 2021-09-23 LAB — SURGICAL PATHOLOGY

## 2021-10-11 DIAGNOSIS — I10 Essential (primary) hypertension: Secondary | ICD-10-CM | POA: Diagnosis not present

## 2021-10-11 DIAGNOSIS — Z020 Encounter for examination for admission to educational institution: Secondary | ICD-10-CM | POA: Diagnosis not present

## 2021-11-17 DIAGNOSIS — E669 Obesity, unspecified: Secondary | ICD-10-CM | POA: Diagnosis not present

## 2021-11-17 DIAGNOSIS — I10 Essential (primary) hypertension: Secondary | ICD-10-CM | POA: Diagnosis not present

## 2021-11-17 DIAGNOSIS — Z6841 Body Mass Index (BMI) 40.0 and over, adult: Secondary | ICD-10-CM | POA: Diagnosis not present

## 2021-12-06 DIAGNOSIS — G4733 Obstructive sleep apnea (adult) (pediatric): Secondary | ICD-10-CM | POA: Diagnosis not present

## 2022-01-05 DIAGNOSIS — M5137 Other intervertebral disc degeneration, lumbosacral region: Secondary | ICD-10-CM | POA: Diagnosis not present

## 2022-01-05 DIAGNOSIS — M47816 Spondylosis without myelopathy or radiculopathy, lumbar region: Secondary | ICD-10-CM | POA: Diagnosis not present

## 2022-01-05 DIAGNOSIS — M4312 Spondylolisthesis, cervical region: Secondary | ICD-10-CM | POA: Diagnosis not present

## 2022-01-05 DIAGNOSIS — M5136 Other intervertebral disc degeneration, lumbar region: Secondary | ICD-10-CM | POA: Diagnosis not present

## 2022-03-07 DIAGNOSIS — G4733 Obstructive sleep apnea (adult) (pediatric): Secondary | ICD-10-CM | POA: Diagnosis not present

## 2022-04-17 DIAGNOSIS — N926 Irregular menstruation, unspecified: Secondary | ICD-10-CM | POA: Diagnosis not present

## 2022-04-17 DIAGNOSIS — Z0000 Encounter for general adult medical examination without abnormal findings: Secondary | ICD-10-CM | POA: Diagnosis not present

## 2022-04-17 DIAGNOSIS — I10 Essential (primary) hypertension: Secondary | ICD-10-CM | POA: Diagnosis not present

## 2022-04-17 DIAGNOSIS — K5790 Diverticulosis of intestine, part unspecified, without perforation or abscess without bleeding: Secondary | ICD-10-CM | POA: Diagnosis not present

## 2022-04-27 DIAGNOSIS — E039 Hypothyroidism, unspecified: Secondary | ICD-10-CM | POA: Diagnosis not present

## 2022-04-27 DIAGNOSIS — E785 Hyperlipidemia, unspecified: Secondary | ICD-10-CM | POA: Diagnosis not present

## 2022-04-27 DIAGNOSIS — R5381 Other malaise: Secondary | ICD-10-CM | POA: Diagnosis not present

## 2022-04-27 DIAGNOSIS — E559 Vitamin D deficiency, unspecified: Secondary | ICD-10-CM | POA: Diagnosis not present

## 2022-04-27 DIAGNOSIS — R5383 Other fatigue: Secondary | ICD-10-CM | POA: Diagnosis not present

## 2022-06-05 DIAGNOSIS — G4733 Obstructive sleep apnea (adult) (pediatric): Secondary | ICD-10-CM | POA: Diagnosis not present

## 2022-09-04 DIAGNOSIS — G4733 Obstructive sleep apnea (adult) (pediatric): Secondary | ICD-10-CM | POA: Diagnosis not present

## 2022-09-07 ENCOUNTER — Ambulatory Visit
Admission: RE | Admit: 2022-09-07 | Discharge: 2022-09-07 | Disposition: A | Discharge status: Home / Self Care | Payer: 59 | Source: Ambulatory Visit | Attending: Student | Admitting: Student

## 2022-09-07 ENCOUNTER — Other Ambulatory Visit: Payer: Self-pay | Admitting: Student

## 2022-09-07 ENCOUNTER — Other Ambulatory Visit: Payer: 59

## 2022-09-07 DIAGNOSIS — M79662 Pain in left lower leg: Secondary | ICD-10-CM

## 2022-09-07 DIAGNOSIS — Z0389 Encounter for observation for other suspected diseases and conditions ruled out: Secondary | ICD-10-CM | POA: Diagnosis not present

## 2022-09-21 ENCOUNTER — Encounter: Payer: Self-pay | Admitting: Nurse Practitioner

## 2022-09-21 ENCOUNTER — Ambulatory Visit: Payer: 59 | Admitting: Nurse Practitioner

## 2022-09-21 VITALS — BP 126/86 | HR 84 | Temp 98.9°F | Ht 62.0 in | Wt 254.4 lb

## 2022-09-21 DIAGNOSIS — Z1231 Encounter for screening mammogram for malignant neoplasm of breast: Secondary | ICD-10-CM

## 2022-09-21 DIAGNOSIS — K219 Gastro-esophageal reflux disease without esophagitis: Secondary | ICD-10-CM | POA: Diagnosis not present

## 2022-09-21 DIAGNOSIS — I10 Essential (primary) hypertension: Secondary | ICD-10-CM | POA: Diagnosis not present

## 2022-09-21 DIAGNOSIS — Z6841 Body Mass Index (BMI) 40.0 and over, adult: Secondary | ICD-10-CM | POA: Diagnosis not present

## 2022-09-21 MED ORDER — HYDROCHLOROTHIAZIDE 25 MG PO TABS: 25.0000 mg | ORAL_TABLET | Freq: Every day | ORAL | 3 refills | Status: DC

## 2022-09-21 NOTE — Assessment & Plan Note (Signed)
Chronic. Stable without medications at this time. Encouraged to monitor diet for triggers. Decrease caffeine and alcohol intake, avoid spicy and acidic foods. Advised to contact if symptoms are worsening or changing. Will monitor.

## 2022-09-21 NOTE — Assessment & Plan Note (Signed)
Chronic. Stable on HCTZ 25 mg daily. Continue. Refills sent. Encouraged to spot check blood pressures at home. Will monitor.

## 2022-09-21 NOTE — Assessment & Plan Note (Signed)
Encouraged healthy diet and exercise. 

## 2022-09-21 NOTE — Progress Notes (Signed)
Bethanie Dicker, NP-C Phone: [phone]  Brandy Nelson is a 52 y.o. female who presents today to establish care. She has no complaints or new concerns today. She is doing well on her blood pressure medication and requesting refills. She has an appointment to establish with Ortho on Monday for left leg pain. She had her annual physical with her previous PCP in November.    HYPERTENSION Disease Monitoring Home BP Monitoring- Not checking Chest pain- No    Dyspnea- No Medications Compliance-  HCTZ. 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 09/09/2020   BMI 45.0-49.9, adult (HCC) 10/02/2019   Hypertension 09/09/2020   Gastroesophageal reflux disease 09/21/2022   Resolved Ambulatory Problems    Diagnosis Date Noted   No Resolved Ambulatory Problems   Past Medical History:  Diagnosis Date   Arthritis    GERD (gastroesophageal reflux disease)    Obesity    Osteoarthritis    Sleep apnea     Family History  Problem Relation Age of Onset   Sarcoidosis Mother    Healthy Father    Hypertension Maternal Aunt    Hypertension Maternal Grandmother    Tuberculosis Maternal Grandfather     Social History   Socioeconomic History   Marital status: Single    Spouse name: Not on file   Number of children: Not on file   Years of education: Not on file   Highest education level: Not on file  Occupational History   Not on file  Tobacco Use    Smoking status: Never   Smokeless tobacco: Never  Vaping Use   Vaping Use: Never used  Substance and Sexual Activity   Alcohol use: Yes    Comment: occasional   Drug use: Not Currently   Sexual activity: Yes  Other Topics Concern   Not on file  Social History Narrative   Not on file   Social Determinants of Health   Financial Resource Strain: Not on file  Food Insecurity: Not on file  Transportation Needs: Not on file  Physical Activity: Not on file  Stress: Not on file  Social Connections: Not on file  Intimate Partner Violence: Not on file    ROS  General:  Negative for unexplained weight loss, fever Skin: Negative for new or changing mole, sore that won't heal HEENT: Negative for trouble hearing, trouble seeing, ringing in ears, mouth sores, hoarseness, change in voice, dysphagia. CV:  Negative for chest pain, dyspnea, edema, palpitations Resp: Negative for cough, dyspnea, hemoptysis GI: Negative for nausea, vomiting, diarrhea, constipation, abdominal pain, melena, hematochezia. GU: Negative for dysuria, incontinence, urinary hesitance, hematuria, vaginal or penile discharge, polyuria, sexual difficulty, lumps in testicle or breasts MSK: Negative for muscle cramps or aches Neuro: Negative for headaches, weakness, numbness, dizziness, passing out/fainting Psych: Negative for depression, anxiety, memory problems  Objective  Physical  Exam Vitals:   09/21/22 1408  BP: 126/86  Pulse: 84  Temp: 98.9 F (37.2 C)  SpO2: 98%    BP Readings from Last 3 Encounters:  09/21/22 126/86  09/22/21 (!) 112/53  02/12/21 139/81   Wt Readings from Last 3 Encounters:  09/21/22 254 lb 6.4 oz (115.4 kg)  09/22/21 230 lb (104.3 kg)  09/16/20 265 lb (120.2 kg)    Physical Exam Constitutional:      General: She is not in acute distress.    Appearance: Normal appearance.  HENT:     Head: Normocephalic.  Cardiovascular:     Rate and Rhythm: Normal rate and regular rhythm.      Heart sounds: Normal heart sounds.  Pulmonary:     Effort: Pulmonary effort is normal.     Breath sounds: Normal breath sounds.  Skin:    General: Skin is warm and dry.  Neurological:     General: No focal deficit present.     Mental Status: She is alert.  Psychiatric:        Mood and Affect: Mood normal.        Behavior: Behavior normal.    Assessment/Plan:   Hypertension, unspecified type Assessment & Plan: Chronic. Stable on HCTZ 25 mg daily. Continue. Refills sent. Encouraged to spot check blood pressures at home. Will monitor.  Orders: -     hydroCHLOROthiazide; Take 1 tablet (25 mg total) by mouth daily.  Dispense: 90 tablet; Refill: 3  Gastroesophageal reflux disease, unspecified whether esophagitis present Assessment & Plan: Chronic. Stable without medications at this time. Encouraged to monitor diet for triggers. Decrease caffeine and alcohol intake, avoid spicy and acidic foods. Advised to contact if symptoms are worsening or changing. Will monitor.    BMI 45.0-49.9, adult [facility]) Assessment & Plan: Encouraged healthy diet and exercise.    Screening mammogram for breast cancer -     3D Screening Mammogram, Left and Right; Future    Return in about 6 months (around 03/24/2023) for Annual Exam.   Bethanie Dicker, NP-C [facility] [employer]

## 2022-09-25 DIAGNOSIS — M1712 Unilateral primary osteoarthritis, left knee: Secondary | ICD-10-CM | POA: Diagnosis not present

## 2022-10-12 ENCOUNTER — Ambulatory Visit
Admission: RE | Admit: 2022-10-12 | Discharge: 2022-10-12 | Disposition: A | Discharge status: Home / Self Care | Payer: 59 | Source: Ambulatory Visit | Attending: Nurse Practitioner | Admitting: Nurse Practitioner

## 2022-10-12 DIAGNOSIS — Z1231 Encounter for screening mammogram for malignant neoplasm of breast: Secondary | ICD-10-CM | POA: Diagnosis not present

## 2022-10-13 ENCOUNTER — Other Ambulatory Visit: Payer: Self-pay | Admitting: *Deleted

## 2022-10-13 ENCOUNTER — Inpatient Hospital Stay
Admission: RE | Admit: 2022-10-13 | Discharge: 2022-10-13 | Disposition: A | Discharge status: Home / Self Care | Payer: Self-pay | Source: Ambulatory Visit | Attending: Nurse Practitioner | Admitting: Nurse Practitioner

## 2022-10-13 DIAGNOSIS — Z1231 Encounter for screening mammogram for malignant neoplasm of breast: Secondary | ICD-10-CM

## 2022-11-24 DIAGNOSIS — G4733 Obstructive sleep apnea (adult) (pediatric): Secondary | ICD-10-CM | POA: Diagnosis not present

## 2022-12-18 DIAGNOSIS — S86811A Strain of other muscle(s) and tendon(s) at lower leg level, right leg, initial encounter: Secondary | ICD-10-CM | POA: Diagnosis not present

## 2022-12-18 DIAGNOSIS — X501XXA Overexertion from prolonged static or awkward postures, initial encounter: Secondary | ICD-10-CM | POA: Diagnosis not present

## 2023-01-03 DIAGNOSIS — M79661 Pain in right lower leg: Secondary | ICD-10-CM | POA: Diagnosis not present

## 2023-01-11 ENCOUNTER — Telehealth: Payer: Self-pay | Admitting: Nurse Practitioner

## 2023-01-11 ENCOUNTER — Other Ambulatory Visit: Payer: Self-pay | Admitting: Nurse Practitioner

## 2023-01-11 DIAGNOSIS — Z0184 Encounter for antibody response examination: Secondary | ICD-10-CM

## 2023-01-11 DIAGNOSIS — R7612 Nonspecific reaction to cell mediated immunity measurement of gamma interferon antigen response without active tuberculosis: Secondary | ICD-10-CM

## 2023-01-11 DIAGNOSIS — Z111 Encounter for screening for respiratory tuberculosis: Secondary | ICD-10-CM

## 2023-01-11 NOTE — Telephone Encounter (Signed)
Pt returned [facility] CMA call. Unable to transfer.

## 2023-01-11 NOTE — Telephone Encounter (Signed)
Pt stated she needs a varicella titer and she stated that we can do the TB blood draw but that she is going to need an xray done as well because she states its going to come back positive

## 2023-01-11 NOTE — Telephone Encounter (Signed)
Returned pts call pt did not answer 2nd vm was not left

## 2023-01-11 NOTE — Telephone Encounter (Signed)
Pt called stating she need a TB and a varicella for school. Pt would like a call back

## 2023-01-11 NOTE — Telephone Encounter (Signed)
LMOM to CB. 

## 2023-01-11 NOTE — Telephone Encounter (Signed)
Patient returned call from [name], CMA.  I transferred call to Latavia. 

## 2023-01-12 ENCOUNTER — Ambulatory Visit: Payer: 59

## 2023-01-12 ENCOUNTER — Other Ambulatory Visit: Payer: 59

## 2023-01-12 DIAGNOSIS — Z111 Encounter for screening for respiratory tuberculosis: Secondary | ICD-10-CM

## 2023-01-12 DIAGNOSIS — Z0184 Encounter for antibody response examination: Secondary | ICD-10-CM | POA: Diagnosis not present

## 2023-01-12 DIAGNOSIS — R7612 Nonspecific reaction to cell mediated immunity measurement of gamma interferon antigen response without active tuberculosis: Secondary | ICD-10-CM

## 2023-01-12 DIAGNOSIS — R7611 Nonspecific reaction to tuberculin skin test without active tuberculosis: Secondary | ICD-10-CM | POA: Diagnosis not present

## 2023-01-14 LAB — QUANTIFERON-TB GOLD PLUS
Mitogen-NIL: >10 [IU]/mL
NIL: 0.03 [IU]/mL
QuantiFERON-TB Gold Plus: POSITIVE — AB
TB1-NIL: 5.8 [IU]/mL
TB2-NIL: 6.64 [IU]/mL

## 2023-01-14 LAB — VARICELLA ZOSTER ANTIBODY, IGG: Varicella IgG: 883 {index}

## 2023-01-16 ENCOUNTER — Encounter: Payer: Self-pay | Admitting: Nurse Practitioner

## 2023-01-16 ENCOUNTER — Other Ambulatory Visit: Payer: Self-pay | Admitting: Nurse Practitioner

## 2023-02-12 DIAGNOSIS — G4733 Obstructive sleep apnea (adult) (pediatric): Secondary | ICD-10-CM | POA: Diagnosis not present

## 2023-03-27 ENCOUNTER — Encounter: Payer: 59 | Admitting: Nurse Practitioner

## 2023-04-24 ENCOUNTER — Encounter: Payer: Self-pay | Admitting: Nurse Practitioner

## 2023-04-24 ENCOUNTER — Ambulatory Visit: Payer: 59 | Admitting: Nurse Practitioner

## 2023-04-24 VITALS — BP 122/86 | HR 76 | Temp 98.8°F | Ht 62.0 in | Wt 260.6 lb

## 2023-04-24 DIAGNOSIS — N920 Excessive and frequent menstruation with regular cycle: Secondary | ICD-10-CM

## 2023-04-24 DIAGNOSIS — Z6841 Body Mass Index (BMI) 40.0 and over, adult: Secondary | ICD-10-CM | POA: Diagnosis not present

## 2023-04-24 DIAGNOSIS — Z1329 Encounter for screening for other suspected endocrine disorder: Secondary | ICD-10-CM | POA: Diagnosis not present

## 2023-04-24 DIAGNOSIS — Z131 Encounter for screening for diabetes mellitus: Secondary | ICD-10-CM

## 2023-04-24 DIAGNOSIS — I10 Essential (primary) hypertension: Secondary | ICD-10-CM

## 2023-04-24 DIAGNOSIS — Z0000 Encounter for general adult medical examination without abnormal findings: Secondary | ICD-10-CM | POA: Diagnosis not present

## 2023-04-24 DIAGNOSIS — Z13220 Encounter for screening for lipoid disorders: Secondary | ICD-10-CM

## 2023-04-24 LAB — COMPREHENSIVE METABOLIC PANEL
ALT: 17 U/L (ref 0–35)
AST: 18 U/L (ref 0–37)
Albumin: 4 g/dL (ref 3.5–5.2)
Alkaline Phosphatase: 56 U/L (ref 39–117)
BUN: 16 mg/dL (ref 6–23)
CO2: 31 meq/L (ref 19–32)
Calcium: 9.6 mg/dL (ref 8.4–10.5)
Chloride: 102 meq/L (ref 96–112)
Creatinine, Ser: 0.94 mg/dL (ref 0.40–1.20)
GFR: 69.48 mL/min (ref >60.00)
Glucose, Bld: 104 mg/dL — ABNORMAL HIGH (ref 70–99)
Potassium: 3.7 meq/L (ref 3.5–5.1)
Sodium: 139 meq/L (ref 135–145)
Total Bilirubin: 0.7 mg/dL (ref 0.2–1.2)
Total Protein: 7.4 g/dL (ref 6.0–8.3)

## 2023-04-24 LAB — LIPID PANEL
Cholesterol: 148 mg/dL (ref 0–200)
HDL: 39.1 mg/dL (ref >39.00)
LDL Cholesterol: 91 mg/dL (ref 0–99)
NonHDL: 109.28
Total CHOL/HDL Ratio: 4
Triglycerides: 93 mg/dL (ref 0.0–149.0)
VLDL: 18.6 mg/dL (ref 0.0–40.0)

## 2023-04-24 LAB — CBC WITH DIFFERENTIAL/PLATELET
Basophils Absolute: 0 10*3/uL (ref 0.0–0.1)
Basophils Relative: 0.8 % (ref 0.0–3.0)
Eosinophils Absolute: 0.2 10*3/uL (ref 0.0–0.7)
Eosinophils Relative: 2.8 % (ref 0.0–5.0)
HCT: 39.8 % (ref 36.0–46.0)
Hemoglobin: 13 g/dL (ref 12.0–15.0)
Lymphocytes Relative: 33 % (ref 12.0–46.0)
Lymphs Abs: 2.1 10*3/uL (ref 0.7–4.0)
MCHC: 32.5 g/dL (ref 30.0–36.0)
MCV: 84.8 fL (ref 78.0–100.0)
Monocytes Absolute: 0.5 10*3/uL (ref 0.1–1.0)
Monocytes Relative: 7.8 % (ref 3.0–12.0)
Neutro Abs: 3.5 10*3/uL (ref 1.4–7.7)
Neutrophils Relative %: 55.6 % (ref 43.0–77.0)
Platelets: 256 10*3/uL (ref 150.0–400.0)
RBC: 4.7 Mil/uL (ref 3.87–5.11)
RDW: 15.3 % (ref 11.5–15.5)
WBC: 6.2 10*3/uL (ref 4.0–10.5)

## 2023-04-24 LAB — TSH: TSH: 1.25 u[IU]/mL (ref 0.35–5.50)

## 2023-04-24 LAB — HEMOGLOBIN A1C: Hgb A1c MFr Bld: 6 % (ref 4.6–6.5)

## 2023-04-24 LAB — VITAMIN D 25 HYDROXY (VIT D DEFICIENCY, FRACTURES): VITD: 46.29 ng/mL (ref 30.00–100.00)

## 2023-04-24 NOTE — Assessment & Plan Note (Signed)
Her annual physical exam was performed. We will order routine lab work and contact her with the results. Pap smear is due, she is scheduled to see Ob-Gyn next week. Her mammogram and colonoscopy are up to date. She received a flu shot this year and her tetanus vaccine is up to date. She has received two doses of the COVID vaccine and declines additional. We recommend the Shingles vaccine, to be obtained at a local pharmacy. Recommended follow ups with dentist and eye doctor for annual exams. Encouraged healthy diet and exercise. Return to care in one year, sooner as needed.

## 2023-04-24 NOTE — Assessment & Plan Note (Signed)
Her hypertension is well-controlled on Hydrochlorothiazide 25 mg daily. We will continue Hydrochlorothiazide.

## 2023-04-24 NOTE — Progress Notes (Signed)
Bethanie Dicker, NP-C Phone: [phone]  Brandy Nelson is a 52 y.o. female who presents today for annual exam.   Discussed the use of AI scribe software for clinical note transcription with the patient, who gave verbal consent to proceed.  History of Present Illness   The patient, a 52 year old with a history of hypertension managed with hydrochlorothiazide, presents with concerns about recent changes in her menstrual cycle. She reports that her periods have become irregular and significantly heavier, with an increase from a typical duration of three days to seven days. The patient also notes the presence of blood clots during menstruation and the need for both tampons and pads due to the increased volume of bleeding. She denies any dysuria, hematuria, or abnormal vaginal discharge.  The patient is sexually active and has an upcoming appointment with a gynecologist. She has no family history of breast, ovarian, or colon cancer. Her last mammogram in May was normal, and her last colonoscopy in 2023 was also unremarkable.  The patient denies any chest pain, shortness of breath, or dizziness. She reports no changes in mood, anxiety, or depression, and sleep is reportedly good. She denies any joint pain or swelling in the legs. The patient's diet is generally balanced, with occasional consumption of soda and snacks. She does not exercise regularly outside of work activities.  The patient's blood pressure is well-controlled on her current medication, with a reading of 122/80 at this visit. She has received two COVID-19 vaccines but has not yet received the shingles vaccine. She is up-to-date on her tetanus shot and has received a flu shot.  The patient is planning to attend nursing school and requires a physical examination for school paperwork. She does not smoke or use drugs, and she consumes alcohol approximately twice a month. She regularly sees a Education officer, community.      Social History   Tobacco Use   Smoking Status Never  Smokeless Tobacco Never    Current Outpatient Medications on File Prior to Visit  Medication Sig Dispense Refill   hydrochlorothiazide (HYDRODIURIL) 25 MG tablet Take 1 tablet (25 mg total) by mouth daily. 90 tablet 3   IBUPROFEN PO Take by mouth 2 (two) times daily.     NUTRITIONAL SUPPLEMENTS PO Take by mouth.     No current facility-administered medications on file prior to visit.    ROS see history of present illness  Objective  Physical Exam Vitals:   04/24/23 1102  BP: 122/86  Pulse: 76  Temp: 98.8 F (37.1 C)  SpO2: 98%    BP Readings from Last 3 Encounters:  04/24/23 122/86  09/21/22 126/86  09/22/21 (!) 112/53   Wt Readings from Last 3 Encounters:  04/24/23 260 lb 9.6 oz (118.2 kg)  09/21/22 254 lb 6.4 oz (115.4 kg)  09/22/21 230 lb (104.3 kg)    Physical Exam Constitutional:      General: She is not in acute distress.    Appearance: Normal appearance.  HENT:     Head: Normocephalic.     Right Ear: Tympanic membrane normal.     Left Ear: Tympanic membrane normal.     Nose: Nose normal.     Mouth/Throat:     Mouth: Mucous membranes are moist.     Pharynx: Oropharynx is clear.  Eyes:     Conjunctiva/sclera: Conjunctivae normal.     Pupils: Pupils are equal, round, and reactive to light.  Neck:     Thyroid: No thyromegaly.  Cardiovascular:  Rate and Rhythm: Normal rate and regular rhythm.     Heart sounds: Normal heart sounds.  Pulmonary:     Effort: Pulmonary effort is normal.     Breath sounds: Normal breath sounds.  Abdominal:     General: Abdomen is flat. Bowel sounds are normal.     Palpations: Abdomen is soft. There is no mass.     Tenderness: There is no abdominal tenderness.  Musculoskeletal:        General: Normal range of motion.  Lymphadenopathy:     Cervical: No cervical adenopathy.  Skin:    General: Skin is warm and dry.     Findings: No rash.  Neurological:     General: No focal deficit present.      Mental Status: She is alert.  Psychiatric:        Mood and Affect: Mood normal.        Behavior: Behavior normal.    Assessment/Plan: Please see individual problem list.  Preventative health care Assessment & Plan: Her annual physical exam was performed. We will order routine lab work and contact her with the results. Pap smear is due, she is scheduled to see Ob-Gyn next week. Her mammogram and colonoscopy are up to date. She received a flu shot this year and her tetanus vaccine is up to date. She has received two doses of the COVID vaccine and declines additional. We recommend the Shingles vaccine, to be obtained at a local pharmacy. Recommended follow ups with dentist and eye doctor for annual exams. Encouraged healthy diet and exercise. Return to care in one year, sooner as needed.   Orders: -     VITAMIN D 25 Hydroxy (Vit-D Deficiency, Fractures)  Menorrhagia with regular cycle Assessment & Plan: She reports irregular periods with increased duration and volume of bleeding, without dysmenorrhea or intermenstrual bleeding. Discussed with patient the possibility of perimenopausal changes; however, the increased bleeding and clotting are concerning. She is scheduled to see Ob-Gyn next week.    Primary hypertension Assessment & Plan: Her hypertension is well-controlled on Hydrochlorothiazide 25 mg daily. We will continue Hydrochlorothiazide.  Orders: -     CBC with Differential/Platelet -     Comprehensive metabolic panel  BMI 45.0-49.9, adult (HCC) -     Hemoglobin A1c  Thyroid disorder screen -     TSH  Lipid screening -     Lipid panel    Return in about 1 year (around 04/23/2024) for Annual Exam, sooner PRN.   Bethanie Dicker, NP-C [facility] - [employer]

## 2023-04-24 NOTE — Assessment & Plan Note (Signed)
She reports irregular periods with increased duration and volume of bleeding, without dysmenorrhea or intermenstrual bleeding. Discussed with patient the possibility of perimenopausal changes; however, the increased bleeding and clotting are concerning. She is scheduled to see Ob-Gyn next week.

## 2023-05-03 ENCOUNTER — Ambulatory Visit (INDEPENDENT_AMBULATORY_CARE_PROVIDER_SITE_OTHER): Payer: 59 | Admitting: Obstetrics and Gynecology

## 2023-05-03 ENCOUNTER — Encounter: Payer: Self-pay | Admitting: Obstetrics and Gynecology

## 2023-05-03 ENCOUNTER — Other Ambulatory Visit (HOSPITAL_COMMUNITY)
Admission: RE | Admit: 2023-05-03 | Discharge: 2023-05-03 | Disposition: A | Discharge status: Home / Self Care | Payer: 59 | Source: Ambulatory Visit | Attending: Obstetrics and Gynecology | Admitting: Obstetrics and Gynecology

## 2023-05-03 VITALS — BP 112/68 | HR 84 | Ht 61.75 in | Wt 257.0 lb

## 2023-05-03 DIAGNOSIS — N939 Abnormal uterine and vaginal bleeding, unspecified: Secondary | ICD-10-CM

## 2023-05-03 DIAGNOSIS — Z124 Encounter for screening for malignant neoplasm of cervix: Secondary | ICD-10-CM | POA: Insufficient documentation

## 2023-05-03 DIAGNOSIS — N951 Menopausal and female climacteric states: Secondary | ICD-10-CM | POA: Diagnosis not present

## 2023-05-03 DIAGNOSIS — D259 Leiomyoma of uterus, unspecified: Secondary | ICD-10-CM | POA: Diagnosis not present

## 2023-05-03 DIAGNOSIS — Z6841 Body Mass Index (BMI) 40.0 and over, adult: Secondary | ICD-10-CM | POA: Diagnosis not present

## 2023-05-03 DIAGNOSIS — Z01419 Encounter for gynecological examination (general) (routine) without abnormal findings: Secondary | ICD-10-CM | POA: Insufficient documentation

## 2023-05-03 NOTE — Patient Instructions (Addendum)
Recommend ibuprofen 600-800mg  TID with meals x5d for AUB.  Health Maintenance, Female Adopting a healthy lifestyle and getting preventive care are important in promoting health and wellness. Ask your health care provider about: The right schedule for you to have regular tests and exams. Things you can do on your own to prevent diseases and keep yourself healthy. What should I know about diet, weight, and exercise? Eat a healthy diet  Eat a diet that includes plenty of vegetables, fruits, low-fat dairy products, and lean protein. Do not eat a lot of foods that are high in solid fats, added sugars, or sodium. Maintain a healthy weight Body mass index (BMI) is used to identify weight problems. It estimates body fat based on height and weight. Your health care provider can help determine your BMI and help you achieve or maintain a healthy weight. Get regular exercise Get regular exercise. This is one of the most important things you can do for your health. Most adults should: Exercise for at least 150 minutes each week. The exercise should increase your heart rate and make you sweat (moderate-intensity exercise). Do strengthening exercises at least twice a week. This is in addition to the moderate-intensity exercise. Spend less time sitting. Even light physical activity can be beneficial. Watch cholesterol and blood lipids Have your blood tested for lipids and cholesterol at 53 years of age, then have this test every 5 years. Have your cholesterol levels checked more often if: Your lipid or cholesterol levels are high. You are older than 53 years of age. You are at high risk for heart disease. What should I know about cancer screening? Depending on your health history and family history, you may need to have cancer screening at various ages. This may include screening for: Breast cancer. Cervical cancer. Colorectal cancer. Skin cancer. Lung cancer. What should I know about heart disease,  diabetes, and high blood pressure? Blood pressure and heart disease High blood pressure causes heart disease and increases the risk of stroke. This is more likely to develop in people who have high blood pressure readings or are overweight. Have your blood pressure checked: Every 3-5 years if you are 48-69 years of age. Every year if you are 65 years old or older. Diabetes Have regular diabetes screenings. This checks your fasting blood sugar level. Have the screening done: Once every three years after age 55 if you are at a normal weight and have a low risk for diabetes. More often and at a younger age if you are overweight or have a high risk for diabetes. What should I know about preventing infection? Hepatitis B If you have a higher risk for hepatitis B, you should be screened for this virus. Talk with your health care provider to find out if you are at risk for hepatitis B infection. Hepatitis C Testing is recommended for: Everyone born from [birth year] through 1966. Anyone with known risk factors for hepatitis C. Sexually transmitted infections (STIs) Get screened for STIs, including gonorrhea and chlamydia, if: You are sexually active and are younger than 53 years of age. You are older than 53 years of age and your health care provider tells you that you are at risk for this type of infection. Your sexual activity has changed since you were last screened, and you are at increased risk for chlamydia or gonorrhea. Ask your health care provider if you are at risk. Ask your health care provider about whether you are at high risk for HIV. Your health care  provider may recommend a prescription medicine to help prevent HIV infection. If you choose to take medicine to prevent HIV, you should first get tested for HIV. You should then be tested every 3 months for as long as you are taking the medicine. Pregnancy If you are about to stop having your period (premenopausal) and you may become pregnant,  seek counseling before you get pregnant. Take 400 to 800 micrograms (mcg) of folic acid every day if you become pregnant. Ask for birth control (contraception) if you want to prevent pregnancy. Osteoporosis and menopause Osteoporosis is a disease in which the bones lose minerals and strength with aging. This can result in bone fractures. If you are 11 years old or older, or if you are at risk for osteoporosis and fractures, ask your health care provider if you should: Be screened for bone loss. Take a calcium or vitamin D supplement to lower your risk of fractures. Be given hormone replacement therapy (HRT) to treat symptoms of menopause. Follow these instructions at home: Alcohol use Do not drink alcohol if: Your health care provider tells you not to drink. You are pregnant, may be pregnant, or are planning to become pregnant. If you drink alcohol: Limit how much you have to: 0-1 drink a day. Know how much alcohol is in your drink. In the [place] one drink equals one 12 oz bottle of beer (355 mL), one 5 oz glass of wine (148 mL), or one 1 oz glass of hard liquor (44 mL). Lifestyle Do not use any products that contain nicotine or tobacco. These products include cigarettes, chewing tobacco, and vaping devices, such as e-cigarettes. If you need help quitting, ask your health care provider. Do not use street drugs. Do not share needles. Ask your health care provider for help if you need support or information about quitting drugs. General instructions Schedule regular health, dental, and eye exams. Stay current with your vaccines. Tell your health care provider if: You often feel depressed. You have ever been abused or do not feel safe at home. Summary Adopting a healthy lifestyle and getting preventive care are important in promoting health and wellness. Follow your health care provider's instructions about healthy diet, exercising, and getting tested or screened for diseases. Follow your  health care provider's instructions on monitoring your cholesterol and blood pressure. This information is not intended to replace advice given to you by your health care provider. Make sure you discuss any questions you have with your health care provider. Document Revised: 09/27/2020 Document Reviewed: 09/27/2020 Elsevier Patient Education  2024 [employer].

## 2023-05-03 NOTE — Assessment & Plan Note (Addendum)
Small fibroids noted in 2019 outside [place] ([place]) Will complete hormonal work-up, [place] and EMB if >53yo or risk factors for endometrial hyperplasia or malignancy. RTO for [place] + EMB. Recommend ibuprofen 600-800mg  TID with meals x5d for AUB. Will discuss further management at that time.

## 2023-05-03 NOTE — Progress Notes (Signed)
52 y.o. [id number] female here for annual exam. 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  Pt states her menstrual cycle has a history of skipping months.   Patient's last menstrual period was 03/27/2023 (approximate). Period Duration (Days): 7 Period Pattern: Regular Menstrual Flow: Heavy Menstrual Control: Maxi pad, Tampon Dysmenorrhea: (!) Severe Dysmenorrhea Symptoms: Cramping, Other (Comment) (leg and back hip pain)  Abnormal bleeding: yes, HVB, cycles used ot be 3d, now 7d with clots Pelvic discharge or pain: none Breast mass, nipple discharge or skin changes : none Birth control: Vasectomy Last PAP: No results found for: "DIAGPAP", "HPVHIGH", "ADEQPAP" Last mammogram: 10/12/2022 BI-RADS 1, density C Last colonoscopy: 09/22/2021 Sexually active: Yes Exercising: No, active lifestyle  GYN HISTORY: No significant history  OB History  Gravida Para Term Preterm AB Living  2 2 2   2   SAB IAB Ectopic Multiple Live Births      2    # Outcome Date GA Lbr Len/2nd Weight Sex Type Anes PTL Lv  2 Term      CS-Unspec   LIV  1 Term      CS-Unspec   LIV    Past Medical History:  Diagnosis Date   Arthritis    GERD (gastroesophageal reflux disease)    Hypertension    Obesity    Osteoarthritis    knee   Sleep apnea     Past Surgical History:  Procedure Laterality Date   CESAREAN SECTION  10/22/1999   12/21/96   CHOLECYSTECTOMY  2001   COLONOSCOPY WITH PROPOFOL N/A 09/22/2021   Procedure: COLONOSCOPY WITH PROPOFOL;  Surgeon: Jaynie Collins, DO;  Location: [facility] ENDOSCOPY;  Service: Gastroenterology;  Laterality: N/A;   TENDON REPAIR  10/15/2019   Quadriceps muscle rupture    Current Outpatient Medications on File Prior to Visit  Medication Sig Dispense Refill   hydrochlorothiazide (HYDRODIURIL) 25 MG tablet Take 1 tablet (25 mg total) by mouth daily. 90 tablet 3   IBUPROFEN PO Take by mouth 2 (two) times daily.     NUTRITIONAL SUPPLEMENTS  PO Take by mouth.     VITAMIN D, CHOLECALCIFEROL, PO Take by mouth.     No current facility-administered medications on file prior to visit.    Social History   Socioeconomic History   Marital status: Single    Spouse name: Not on file   Number of children: Not on file   Years of education: Not on file   Highest education level: Not on file  Occupational History   Not on file  Tobacco Use   Smoking status: Never   Smokeless tobacco: Never  Vaping Use   Vaping status: Never Used  Substance and Sexual Activity   Alcohol use: Yes    Comment: occasional   Drug use: Not Currently   Sexual activity: Yes    Birth control/protection: None    Comment: Partner has Vasectomy  Other Topics Concern   Not on file  Social History Narrative   Not on file   Social Drivers of Health   Financial Resource Strain: Not on file  Food Insecurity: Not on file  Transportation Needs: Not on file  Physical Activity: Not on file  Stress: Not on file  Social Connections: Not on file  Intimate Partner Violence: Not on file    Family History  Problem Relation Age of Onset   Sarcoidosis Mother    Healthy Father    Hypertension Maternal Aunt  Hypertension Maternal Grandmother    Tuberculosis Maternal Grandfather     Allergies  Allergen Reactions   Tramadol Nausea Only and Itching      PE Today's Vitals   05/03/23 1134  BP: 112/68  Pulse: 84  SpO2: 96%  Weight: 257 lb (116.6 kg)  Height: 5' 1.75" (1.568 m)   Body mass index is 47.39 kg/m.  Physical Exam Vitals reviewed. Exam conducted with a chaperone present.  Constitutional:      General: She is not in acute distress.    Appearance: Normal appearance.  HENT:     Head: Normocephalic and atraumatic.     Nose: Nose normal.  Eyes:     Extraocular Movements: Extraocular movements intact.     Conjunctiva/sclera: Conjunctivae normal.  Neck:     Thyroid: No thyroid mass, thyromegaly or thyroid tenderness.  Pulmonary:      Effort: Pulmonary effort is normal.  Chest:     Chest wall: No mass or tenderness.  Breasts:    Right: Normal. No swelling, mass, nipple discharge, skin change or tenderness.     Left: Normal. No swelling, mass, nipple discharge, skin change or tenderness.  Abdominal:     General: There is no distension.     Palpations: Abdomen is soft.     Tenderness: There is no abdominal tenderness.  Genitourinary:    General: Normal vulva.     Exam position: Lithotomy position.     Urethra: No prolapse.     Vagina: Normal. No vaginal discharge or bleeding.     Cervix: Normal. No lesion.     Uterus: Normal. Not enlarged and not tender.      Adnexa: Right adnexa normal and left adnexa normal.  Musculoskeletal:        General: Normal range of motion.     Cervical back: Normal range of motion.  Lymphadenopathy:     Upper Body:     Right upper body: No axillary adenopathy.     Left upper body: No axillary adenopathy.     Lower Body: No right inguinal adenopathy. No left inguinal adenopathy.  Skin:    General: Skin is warm and dry.  Neurological:     General: No focal deficit present.     Mental Status: She is alert.  Psychiatric:        Mood and Affect: Mood normal.        Behavior: Behavior normal.       Assessment and Plan:        Cervical cancer screening -     Cytology - PAP  Well woman exam with routine gynecological exam Assessment & Plan: Cervical cancer screening performed according to ASCCP guidelines. Encouraged annual mammogram screening Colonoscopy UTD DXA N/A Labs and immunizations with her primary Encouraged safe sexual practices as indicated Encouraged healthy lifestyle practices with diet and exercise For patients under 50-70yo, I recommend 1200mg  calcium daily and 600IU of vitamin D daily.    Perimenopause Assessment & Plan: Check FSH today  Orders: -     Follicle stimulating hormone -     Thyroid Panel With TSH -     CBC -     US PELVIS TRANSVAGINAL  NON-OB (TV ONLY); Future -     hCG, quantitative, pregnancy -     Endometrial biopsy; Future  Abnormal uterine bleeding (AUB) Assessment & Plan: Small fibroids noted in 2019 outside [place] ([place]) Will complete hormonal work-up, [place] and EMB if >45yo or risk factors for endometrial hyperplasia  or malignancy. RTO for [place] + EMB. Recommend ibuprofen 600-800mg  TID with meals x5d for AUB. Will discuss further management at that time.    BMI 45.0-49.9, adult [facility]) Assessment & Plan: Encouraged healthy diet and exercise.   Uterine leiomyoma, unspecified location    Rosalyn Gess, MD

## 2023-05-03 NOTE — Assessment & Plan Note (Signed)
 Cervical cancer screening performed according to ASCCP guidelines. Encouraged annual mammogram screening Colonoscopy UTD DXA N/A Labs and immunizations with her primary Encouraged safe sexual practices as indicated Encouraged healthy lifestyle practices with diet and exercise For patients under 50-53yo, I recommend 1200mg  calcium daily and 600IU of vitamin D daily.

## 2023-05-03 NOTE — Assessment & Plan Note (Signed)
Check FSH today

## 2023-05-03 NOTE — Assessment & Plan Note (Signed)
Encouraged healthy diet and exercise

## 2023-05-04 LAB — THYROID PANEL WITH TSH
Free Thyroxine Index: 2.2 (ref 1.4–3.8)
T3 Uptake: 32 % (ref 22–35)
T4, Total: 7 ug/dL (ref 5.1–11.9)
TSH: 0.74 m[IU]/L

## 2023-05-04 LAB — CBC
HCT: 40.4 % (ref 35.0–45.0)
Hemoglobin: 13 g/dL (ref 11.7–15.5)
MCH: 27.4 pg (ref 27.0–33.0)
MCHC: 32.2 g/dL (ref 32.0–36.0)
MCV: 85.1 fL (ref 80.0–100.0)
MPV: 12.6 fL — ABNORMAL HIGH (ref 7.5–12.5)
Platelets: 284 10*3/uL (ref 140–400)
RBC: 4.75 10*6/uL (ref 3.80–5.10)
RDW: 13.7 % (ref 11.0–15.0)
WBC: 6.2 10*3/uL (ref 3.8–10.8)

## 2023-05-04 LAB — FOLLICLE STIMULATING HORMONE: FSH: 5.3 m[IU]/mL

## 2023-05-04 LAB — HCG, QUANTITATIVE, PREGNANCY: HCG, Total, QN: <5 m[IU]/mL

## 2023-05-08 LAB — CYTOLOGY - PAP
Comment: NEGATIVE
Diagnosis: UNDETERMINED — AB
High risk HPV: NEGATIVE

## 2023-05-14 DIAGNOSIS — G4733 Obstructive sleep apnea (adult) (pediatric): Secondary | ICD-10-CM | POA: Diagnosis not present

## 2023-05-25 ENCOUNTER — Ambulatory Visit: Payer: Self-pay | Admitting: Nurse Practitioner

## 2023-05-25 DIAGNOSIS — G4733 Obstructive sleep apnea (adult) (pediatric): Secondary | ICD-10-CM | POA: Diagnosis not present

## 2023-05-25 NOTE — Telephone Encounter (Signed)
 Copied from CRM [id number]. Topic: Clinical - Red Word Triage >> May 25, 2023  8:18 AM Robinson H wrote: [facility] that prompted transfer to Nurse Triage: Pain in left breast inner part, no lumps but hurst to the touch.  Chief Complaint: left breast pain Symptoms: pain Frequency: constant but only hurts when touched Pertinent Negatives: Patient denies injury, nipple discharge and fever Disposition: [] ED /[] Urgent Care (no appt availability in office) / [x] Appointment(In office/virtual)/ []  [facility]/ [] Home Care/ [] Refused Recommended Disposition /[] [facility]/ []  Follow-up with PCP Additional Notes: states breast pain started this week.  Denies injury, fever and nipple discharge.  Apt made for Monday.  Instructed to go to er if becomes worse.   Reason for Disposition  Breast lump  Answer Assessment - Initial Assessment Questions 1. SYMPTOM: What's the main symptom you're concerned about?  (e.g., lump, pain, rash, nipple discharge)     pain 2. LOCATION: Where is the pain located?     Left breast 3. ONSET: When did pain  start?     The last week 4. PRIOR HISTORY: Do you have any history of prior problems with your breasts? (e.g., lumps, cancer, fibrocystic breast disease)     denies 5. CAUSE: What do you think is causing this symptom?     unknown 6. OTHER SYMPTOMS: Do you have any other symptoms? (e.g., fever, breast pain, redness or rash, nipple discharge)     deines 7. PREGNANCY-BREASTFEEDING: Is there any chance you are pregnant? When was your last menstrual period? Are you breastfeeding?     Denies.  Protocols used: Breast Symptoms-A-AH

## 2023-05-28 ENCOUNTER — Telehealth: Payer: Self-pay | Admitting: Nurse Practitioner

## 2023-05-28 ENCOUNTER — Ambulatory Visit: Payer: 59 | Admitting: Internal Medicine

## 2023-05-28 NOTE — Telephone Encounter (Signed)
 Left message to call the office to reschedule due to weather

## 2023-05-29 ENCOUNTER — Other Ambulatory Visit (HOSPITAL_COMMUNITY)
Admission: RE | Admit: 2023-05-29 | Discharge: 2023-05-29 | Disposition: A | Discharge status: Home / Self Care | Payer: 59 | Source: Ambulatory Visit | Attending: Obstetrics and Gynecology | Admitting: Obstetrics and Gynecology

## 2023-05-29 ENCOUNTER — Encounter: Payer: Self-pay | Admitting: Obstetrics and Gynecology

## 2023-05-29 ENCOUNTER — Ambulatory Visit: Payer: 59 | Admitting: Obstetrics and Gynecology

## 2023-05-29 ENCOUNTER — Ambulatory Visit (INDEPENDENT_AMBULATORY_CARE_PROVIDER_SITE_OTHER): Payer: 59

## 2023-05-29 VITALS — BP 124/64 | HR 82 | Ht 62.0 in | Wt 249.0 lb

## 2023-05-29 DIAGNOSIS — N951 Menopausal and female climacteric states: Secondary | ICD-10-CM | POA: Diagnosis not present

## 2023-05-29 DIAGNOSIS — Z01818 Encounter for other preprocedural examination: Secondary | ICD-10-CM | POA: Diagnosis not present

## 2023-05-29 DIAGNOSIS — D259 Leiomyoma of uterus, unspecified: Secondary | ICD-10-CM | POA: Diagnosis not present

## 2023-05-29 DIAGNOSIS — N939 Abnormal uterine and vaginal bleeding, unspecified: Secondary | ICD-10-CM | POA: Insufficient documentation

## 2023-05-29 LAB — PREGNANCY, URINE: Preg Test, Ur: NEGATIVE

## 2023-05-29 MED ORDER — LIDOCAINE HCL (PF) 1 % IJ SOLN: 10.0000 mL | Freq: Once | INTRAMUSCULAR | Status: DC

## 2023-05-29 NOTE — Patient Instructions (Signed)
 It is common to have vaginal bleeding and cramping for up to 72 hours after your biopsy. Please call our office with heavy vaginal bleeding, severe abdominal pain or fever. Avoid intercourse, tampon use, douching and baths for 7 days to decrease the risk of infection.

## 2023-05-29 NOTE — Assessment & Plan Note (Signed)
 Reviewed ultrasound revealing: 9 cm multifibroid uterus.  Discussed pathology of fibroids and benign nature. Reviewed that management of fibroids is dependent upon associated symptoms. Asymptomatic fibroids can be managed expectantly. However, fibroids can cause AUB and bulk symptoms, including dysmenorrhea, pelvic and lower back pain and pressure, dyspareunia, and constipation.  Reviewed hormonal and nonhormonal management, including the Mirena IUD. Also reviewed surgical management including endometrial ablation and hysterectomy.  Pamphlets were provided.  Patient to follow-up when she has decided.

## 2023-05-29 NOTE — Progress Notes (Signed)
 53 y.o. G84P2002 female with AUB here for vaginal ultrasound and endometrial biopsy. 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  Pt states her menstrual cycle has a history of skipping months.  Also notes HVB, cycles used to be 3d, now 7d with clots.  05/03/2023 workup with normal FSH, TSH, CBC, hCG  Patient's last menstrual period was 05/07/2023 (approximate).   Birth control: Vasectomy Last PAP:     Component Value Date/Time   DIAGPAP (A) 05/03/2023 1219    - Atypical squamous cells of undetermined significance (ASC-US )   HPVHIGH Negative 05/03/2023 1219   ADEQPAP  05/03/2023 1219    Satisfactory for evaluation; transformation zone component PRESENT.   Last mammogram: 10/12/2022 BI-RADS 1, density C Last colonoscopy: 09/22/2021 Sexually active: Yes Exercising: No, active lifestyle  GYN HISTORY: No significant history  OB History  Gravida Para Term Preterm AB Living  2 2 2   2   SAB IAB Ectopic Multiple Live Births      2    # Outcome Date GA Lbr Len/2nd Weight Sex Type Anes PTL Lv  2 Term      CS-Unspec   LIV  1 Term      CS-Unspec   LIV    Past Medical History:  Diagnosis Date   Arthritis    GERD (gastroesophageal reflux disease)    Hypertension    Obesity    Osteoarthritis    knee   Sleep apnea     Past Surgical History:  Procedure Laterality Date   CESAREAN SECTION  10/22/1999   12/21/96   CHOLECYSTECTOMY  2001   COLONOSCOPY WITH PROPOFOL  N/A 09/22/2021   Procedure: COLONOSCOPY WITH PROPOFOL ;  Surgeon: Onita Elspeth Sharper, DO;  Location: [facility] ENDOSCOPY;  Service: Gastroenterology;  Laterality: N/A;   TENDON REPAIR  10/15/2019   Quadriceps muscle rupture    Current Outpatient Medications on File Prior to Visit  Medication Sig Dispense Refill   hydrochlorothiazide  (HYDRODIURIL ) 25 MG tablet Take 1 tablet (25 mg total) by mouth daily. 90 tablet 3   IBUPROFEN  PO Take by mouth 2 (two) times daily.     NUTRITIONAL SUPPLEMENTS PO  Take by mouth.     VITAMIN D , CHOLECALCIFEROL, PO Take by mouth.     No current facility-administered medications on file prior to visit.    Allergies  Allergen Reactions   Tramadol Nausea Only and Itching    PE Today's Vitals   05/29/23 1517  BP: 124/64  Pulse: 82  SpO2: 97%  Weight: 249 lb (112.9 kg)  Height: 5' 2 (1.575 m)   Body mass index is 45.54 kg/m.  Physical Exam Vitals reviewed. Exam conducted with a chaperone present.  Constitutional:      General: She is not in acute distress.    Appearance: Normal appearance.  HENT:     Head: Normocephalic and atraumatic.     Nose: Nose normal.  Eyes:     Extraocular Movements: Extraocular movements intact.     Conjunctiva/sclera: Conjunctivae normal.  Pulmonary:     Effort: Pulmonary effort is normal.  Genitourinary:    General: Normal vulva.     Exam position: Lithotomy position.     Vagina: Normal. No vaginal discharge.     Cervix: Normal. No cervical motion tenderness, discharge or lesion.     Uterus: Normal. Not enlarged and not tender.      Adnexa: Right adnexa normal and left adnexa normal.  Musculoskeletal:  General: Normal range of motion.     Cervical back: Normal range of motion.  Neurological:     General: No focal deficit present.     Mental Status: She is alert.  Psychiatric:        Mood and Affect: Mood normal.        Behavior: Behavior normal.      TVUS 05/29/23: Indications: Abnormal uterine bleeding, known fibroids  Findings:   Uterus: 9.5 x 6.3 x 5.4 cm, anteverted with multiple fibroids. Endometrial thickness: 5.7 mm. Fibroids: 1-1.7 x 2.1 cm, intramural 2-1.2 x 0.6 cm, intramural 3-1.3 x 0.7 cm-intramural 4-1.9 x 1.8 cm, intramural 5-1.5 x 1.4 cm, subserosal Left ovary: 2.5 x 1.6 x 1.4 cm, 2.7 cm cube.  Normal-appearing. Right ovary: 2.6 x 1.5 x 1.8 cm, 3.6 cm cube.  Normal-appearing. No free fluid.  Impression:  Multifibroid uterus.  Vera LULLA Pa,  MD   Procedure Speculum inserted into the vagina, cervix visualized and was prepped with Betadine.  Cervical block was performed with 10cc 1% lidocaine  (Lot#: [id number], Exp 01/2025). A single-toothed tenaculum was placed on the anterior lip of the cervix to stabilize it.  The 3 mm pipelle was introduced into the endometrial cavity without difficulty to a depth of 10cm, suction initiated over 2 passed and a moderate amount of tissue was obtained and sent to pathology.  The instruments were removed from the patient's vagina.  Minimal bleeding from the cervix was noted.  The patient tolerated the procedure well.    Assessment and Plan:        Pre-procedural examination -     Pregnancy, urine  Abnormal uterine bleeding (AUB) Assessment & Plan: Workup to date notable for multifibroid uterus. Uncomplicated EMB See below for management discussion  Orders: -     Lidocaine  HCl (PF) -     Surgical pathology  Uterine leiomyoma, unspecified location Assessment & Plan: Reviewed ultrasound revealing: 9 cm multifibroid uterus.  Discussed pathology of fibroids and benign nature. Reviewed that management of fibroids is dependent upon associated symptoms. Asymptomatic fibroids can be managed expectantly. However, fibroids can cause AUB and bulk symptoms, including dysmenorrhea, pelvic and lower back pain and pressure, dyspareunia, and constipation.  Reviewed hormonal and nonhormonal management, including the Mirena IUD. Also reviewed surgical management including endometrial ablation and hysterectomy.  Pamphlets were provided.  Patient to follow-up when she has decided.    Perimenopause -     Endometrial biopsy    Vera LULLA Pa, MD

## 2023-05-29 NOTE — Assessment & Plan Note (Addendum)
 Workup to date notable for multifibroid uterus. Uncomplicated EMB See below for management discussion

## 2023-05-30 ENCOUNTER — Encounter: Payer: Self-pay | Admitting: Internal Medicine

## 2023-05-30 ENCOUNTER — Ambulatory Visit: Payer: 59 | Admitting: Internal Medicine

## 2023-05-30 VITALS — BP 114/76 | HR 82 | Temp 98.2°F | Ht 62.0 in | Wt 247.8 lb

## 2023-05-30 DIAGNOSIS — R0789 Other chest pain: Secondary | ICD-10-CM | POA: Diagnosis not present

## 2023-05-30 DIAGNOSIS — K219 Gastro-esophageal reflux disease without esophagitis: Secondary | ICD-10-CM

## 2023-05-30 MED ORDER — FAMOTIDINE 20 MG PO TABS: 20.0000 mg | ORAL_TABLET | Freq: Every day | ORAL | 0 refills | Status: DC

## 2023-05-30 MED ORDER — DICLOFENAC SODIUM 1 % EX GEL: 4.0000 g | Freq: Four times a day (QID) | CUTANEOUS | 1 refills | Status: DC

## 2023-05-30 NOTE — Assessment & Plan Note (Signed)
-  This problem is chronic and worsening -Patient states that her symptoms had resolved previously after doing a cleanse with her chiropractor -She complains of heartburn and increased burping which are similar to her previous reflux symptoms -I prescribed Pepcid  20 mg daily for her and instructed her to follow-up with us  if her symptoms do not improve -No further workup at this time

## 2023-05-30 NOTE — Progress Notes (Signed)
 Acute Office Visit  Subjective:     Patient ID: Brandy Nelson, female    DOB: [birth year], 53 y.o.   MRN: [id number]  Chief Complaint  Patient presents with   Acute Visit    Breast pain    HPI Patient is in today for chest pain x 1 week.  Patient states that she developed pain under her left breast approximately 1 week ago.  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  She has been taking some ibuprofen  at home which has helped with her symptoms and her pain has slowly been improving over the last week.  She denies any fevers or chills.  No cough or shortness of breath.  No recent travel including car trips or plane rides  Review of Systems  Constitutional: Negative.  Negative for chills and fever.  Respiratory: Negative.  Negative for cough and shortness of breath.   Cardiovascular:  Positive for chest pain. Negative for palpitations.  Gastrointestinal:  Positive for heartburn.  Neurological: Negative.   Psychiatric/Behavioral: Negative.          Objective:    BP 114/76   Pulse 82   Temp 98.2 F (36.8 C)   Ht 5' 2 (1.575 m)   Wt 247 lb 12.8 oz (112.4 kg)   LMP 05/07/2023 (Approximate)   SpO2 99%   BMI 45.32 kg/m    Physical Exam Constitutional:      Appearance: Normal appearance.  HENT:     Head: Normocephalic and atraumatic.  Cardiovascular:     Rate and Rhythm: Normal rate and regular rhythm.     Heart sounds: Normal heart sounds.  Pulmonary:     Effort: Pulmonary effort is normal. No respiratory distress.     Breath sounds: Normal breath sounds. No wheezing or rales.     Comments: Patient with tenderness on palpation under her left breast which reproduces her symptoms of chest pain Chest:     Chest wall: Tenderness present.  Abdominal:     General: Bowel sounds are normal. There is no distension.     Palpations: Abdomen is soft.     Tenderness: There is no abdominal tenderness.  Neurological:     General: No focal deficit present.      Mental Status: She is alert.  Psychiatric:        Mood and Affect: Mood normal.        Behavior: Behavior normal.    No results found for any visits on 05/30/23.      Assessment & Plan:   Problem List Items Addressed This Visit       Digestive   Gastroesophageal reflux disease   -This problem is chronic and worsening -Patient states that her symptoms had resolved previously after doing a cleanse with her chiropractor -She complains of heartburn and increased burping which are similar to her previous reflux symptoms -I prescribed Pepcid  20 mg daily for her and instructed her to follow-up with us  if her symptoms do not improve -No further workup at this time      Relevant Medications   famotidine  (PEPCID ) 20 MG tablet     Other   Musculoskeletal chest pain - Primary   -Patient complained of left-sided chest pain over the last week which is slowly improving -Patient states that pain occurs only when she takes a deep breath -She has been taking ibuprofen  at home for this with some improvement in her pain -Her Wells score for PE is 0.  She has had no long trips or periods of immobilization and no leg swelling -She has no cough or fevers or chills or shortness of breath.  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  I instructed her not to use the ibuprofen  with Voltaren  gel and that the Voltaren  gel is less likely to exacerbate her GERD -She will follow-up with us  if her pain worsens or she has worsening symptoms      Relevant Medications   diclofenac  Sodium (VOLTAREN ) 1 % GEL    Meds ordered this encounter  Medications   diclofenac  Sodium (VOLTAREN ) 1 % GEL    Sig: Apply 4 g topically 4 (four) times daily.    Dispense:  100 g     Refill:  1   famotidine  (PEPCID ) 20 MG tablet    Sig: Take 1 tablet (20 mg total) by mouth daily.    Dispense:  30 tablet    Refill:  0    No follow-ups on file.  Kateria Cutrona, MD

## 2023-05-30 NOTE — Patient Instructions (Signed)
-  It was a pleasure meeting you today -The most likely cause of your pain is secondary to a pulled muscle -Please continue with your ibuprofen  as needed.  I have also prescribed Voltaren  gel for your which you can apply at the site of the pain which should give you some relief -If you develop worsening symptoms including shortness of breath, cough, fevers please give our office a call and follow-up with us  -I have also prescribed Pepcid  for your reflux.  If this does not improve please give us  a call

## 2023-05-30 NOTE — Assessment & Plan Note (Signed)
-  Patient complained of left-sided chest pain over the last week which is slowly improving -Patient states that pain occurs only when she takes a deep breath -She has been taking ibuprofen  at home for this with some improvement in her pain -Her Wells score for PE is 0.  She has had no long trips or periods of immobilization and no leg swelling -She has no cough or fevers or chills or shortness of breath.  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  I instructed her not to use the ibuprofen  with Voltaren  gel and that the Voltaren  gel is less likely to exacerbate her GERD -She will follow-up with us  if her pain worsens or she has worsening symptoms

## 2023-05-31 LAB — SURGICAL PATHOLOGY

## 2023-06-07 DIAGNOSIS — N939 Abnormal uterine and vaginal bleeding, unspecified: Secondary | ICD-10-CM

## 2023-06-11 NOTE — Telephone Encounter (Signed)
Spoke with patient, she would like to proceed with endometrial ablation with Dr. Kennith Center.   Starting school beginning of March, would like to schedule end of Feb if possible. Advised once surgery referral received and benefits reviewed, I will return call to review dates. Patient verbalizes understanding and is agreeable.

## 2023-06-11 NOTE — Telephone Encounter (Signed)
Reviewed 05/29/23 OV notes.   Dr. Kennith Center, please advise on referral for endometrial ablation.

## 2023-06-26 ENCOUNTER — Telehealth: Payer: Self-pay

## 2023-06-26 ENCOUNTER — Other Ambulatory Visit: Payer: Self-pay | Admitting: Internal Medicine

## 2023-06-26 DIAGNOSIS — K219 Gastro-esophageal reflux disease without esophagitis: Secondary | ICD-10-CM

## 2023-06-26 NOTE — Telephone Encounter (Signed)
Detailed vm left informing pt to CB and get scheduled for surgical clearance.    Please have pt scheduled at the end of this month for surgical clearance.

## 2023-06-26 NOTE — Telephone Encounter (Signed)
Copied from CRM [id number]. Topic: General - Other >> Jun 26, 2023  3:54 PM Almira Coaster wrote: Reason for CRM: Patient returned a called to schedule appointment for surgery clearance, scheduled patient for 07/10/2023 at 2:40 pm.

## 2023-07-03 ENCOUNTER — Other Ambulatory Visit: Payer: Self-pay | Admitting: Nurse Practitioner

## 2023-07-03 NOTE — Telephone Encounter (Signed)
Provider letter faxed to [phone] with a completed transmission log

## 2023-07-03 NOTE — Telephone Encounter (Signed)
Detailed vm left informing pt hat she does not need an appt

## 2023-07-04 ENCOUNTER — Other Ambulatory Visit: Payer: Self-pay | Admitting: Obstetrics and Gynecology

## 2023-07-04 ENCOUNTER — Encounter: Payer: Self-pay | Admitting: *Deleted

## 2023-07-10 ENCOUNTER — Ambulatory Visit: Payer: 59 | Admitting: Nurse Practitioner

## 2023-07-11 ENCOUNTER — Encounter: Payer: 59 | Admitting: Obstetrics and Gynecology

## 2023-07-13 ENCOUNTER — Encounter (HOSPITAL_COMMUNITY): Payer: Self-pay | Admitting: Obstetrics and Gynecology

## 2023-07-13 ENCOUNTER — Other Ambulatory Visit: Payer: Self-pay

## 2023-07-13 ENCOUNTER — Encounter: Payer: Self-pay | Admitting: Obstetrics and Gynecology

## 2023-07-13 ENCOUNTER — Ambulatory Visit (INDEPENDENT_AMBULATORY_CARE_PROVIDER_SITE_OTHER): Payer: 59 | Admitting: Obstetrics and Gynecology

## 2023-07-13 VITALS — BP 112/72 | HR 86 | Temp 98.7°F | Ht 62.0 in | Wt 242.0 lb

## 2023-07-13 DIAGNOSIS — N939 Abnormal uterine and vaginal bleeding, unspecified: Secondary | ICD-10-CM

## 2023-07-13 MED ORDER — IBUPROFEN 800 MG PO TABS: 800.0000 mg | ORAL_TABLET | Freq: Three times a day (TID) | ORAL | 0 refills | Status: AC | PRN

## 2023-07-13 NOTE — Addendum Note (Signed)
 Addended by: Darrell Jewel V on: 07/13/2023 11:18 AM   Modules accepted: Orders

## 2023-07-13 NOTE — H&P (View-Only) (Signed)
 53 y.o. G21P2002 female with AUB-F, HTN, morbid obesity, GERD, prior CDx1 here for preoperative visit for planned diagnostic hysteroscopy, D&C, Novasure endometrial ablation. 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   Patient's last menstrual period was 05/07/2023 (approximate).   Patient was seen 05/29/23 and reported skipping months.  Also notes HVB, cycles used to be 3d, now 7d with clots. Today, she reports she is doing well.  No chest pain or shortness of breath.   05/03/2023 workup with normal FSH, TSH, CBC, hCG TVUS 05/29/23: Uterus: 9.5 x 6.3 x 5.4 cm, anteverted with multiple fibroids. Endometrial thickness: 5.7 mm. Fibroids: 1-1.7 x 2.1 cm, intramural 2-1.2 x 0.6 cm, intramural 3-1.3 x 0.7 cm-intramural 4-1.9 x 1.8 cm, intramural 5-1.5 x 1.4 cm, subserosal Left ovary: 2.5 x 1.6 x 1.4 cm, 2.7 cm cube.  Normal-appearing. Right ovary: 2.6 x 1.5 x 1.8 cm, 3.6 cm cube.  Normal-appearing. No free fluid. 05/29/23 EMB benign  Birth control: Vasectomy   Last PAP:    Component Value Date/Time   DIAGPAP (A) 05/03/2023 1219    - Atypical squamous cells of undetermined significance (ASC-US)   HPVHIGH Negative 05/03/2023 1219   ADEQPAP  05/03/2023 1219    Satisfactory for evaluation; transformation zone component PRESENT.   GYN HISTORY: No significant history  OB History  Gravida Para Term Preterm AB Living  2 2 2   2   SAB IAB Ectopic Multiple Live Births      2    # Outcome Date GA Lbr Len/2nd Weight Sex Type Anes PTL Lv  2 Term      CS-Unspec   LIV  1 Term      CS-Unspec   LIV    Past Medical History:  Diagnosis Date   Arthritis    GERD (gastroesophageal reflux disease)    Hypertension    Obesity    Osteoarthritis    knee   Sleep apnea     Past Surgical History:  Procedure Laterality Date   CESAREAN SECTION  10/22/1999   12/21/96   CHOLECYSTECTOMY  2001   COLONOSCOPY WITH PROPOFOL N/A 09/22/2021   Procedure: COLONOSCOPY WITH  PROPOFOL;  Surgeon: Jaynie Collins, DO;  Location: [facility] ENDOSCOPY;  Service: Gastroenterology;  Laterality: N/A;   TENDON REPAIR  10/15/2019   Quadriceps muscle rupture    Current Outpatient Medications on File Prior to Visit  Medication Sig Dispense Refill   Cholecalciferol (VITAMIN D3) 50 MCG (2000 UT) CAPS Take 2,000 Units by mouth daily.     famotidine (PEPCID) 20 MG tablet TAKE 1 TABLET(20 MG) BY MOUTH DAILY 90 tablet 3   hydrochlorothiazide (HYDRODIURIL) 25 MG tablet Take 1 tablet (25 mg total) by mouth daily. 90 tablet 3   Multiple Vitamin (MULTIVITAMIN) capsule Take 1 capsule by mouth daily.     Probiotic Product (PROBIOTIC PO) Take 1 tablet by mouth daily.     No current facility-administered medications on file prior to visit.    Allergies  Allergen Reactions   Tramadol Nausea Only and Itching      PE Today's Vitals   07/13/23 0856  BP: 112/72  Pulse: 86  Temp: 98.7 F (37.1 C)  TempSrc: Oral  SpO2: 98%  Weight: 242 lb (109.8 kg)  Height: 5\' 2"  (1.575 m)   Body mass index is 44.26 kg/m.  Physical Exam Vitals reviewed.  Constitutional:      General: She is not in acute distress.    Appearance: Normal  appearance.  HENT:     Head: Normocephalic and atraumatic.     Nose: Nose normal.  Eyes:     Extraocular Movements: Extraocular movements intact.     Conjunctiva/sclera: Conjunctivae normal.  Cardiovascular:     Rate and Rhythm: Normal rate and regular rhythm.     Heart sounds: No murmur heard.    No friction rub. No gallop.  Pulmonary:     Effort: Pulmonary effort is normal. No respiratory distress.     Breath sounds: Normal breath sounds. No stridor. No wheezing, rhonchi or rales.  Musculoskeletal:        General: Normal range of motion.     Cervical back: Normal range of motion.  Neurological:     General: No focal deficit present.     Mental Status: She is alert.  Psychiatric:        Mood and Affect: Mood normal.        Behavior: Behavior  normal.       Assessment and Plan:        Abnormal uterine bleeding (AUB) Assessment & Plan: Plan for diagnostic hysteroscopy, dilation and curettage, NovaSure endometrial ablation.  Discussed outpatient procedure. Reviewed that  recovery is usually 1-2 days. Risks including infections, bleeding, and damage to surrounding organs reviewed. Recommend NPO prior to midnight and reviewed medication to take on day of surgery. Dicussed use of NSAIDS as needed for pain postoperatively.  Preop checklist: Antibiotics: none DVT ppx: SCDs Postop visit: 1 week Additional clearance: Received medical clearance   Orders: -     Ibuprofen; Take 1 tablet (800 mg total) by mouth every 8 (eight) hours as needed for up to 42 doses.  Dispense: 42 tablet; Refill: 0   Rosalyn Gess, MD

## 2023-07-13 NOTE — Progress Notes (Addendum)
 53 y.o. G21P2002 female with AUB-F, HTN, morbid obesity, GERD, prior CDx1 here for preoperative visit for planned diagnostic hysteroscopy, D&C, Novasure endometrial ablation. 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   Patient's last menstrual period was 05/07/2023 (approximate).   Patient was seen 05/29/23 and reported skipping months.  Also notes HVB, cycles used to be 3d, now 7d with clots. Today, she reports she is doing well.  No chest pain or shortness of breath.   05/03/2023 workup with normal FSH, TSH, CBC, hCG TVUS 05/29/23: Uterus: 9.5 x 6.3 x 5.4 cm, anteverted with multiple fibroids. Endometrial thickness: 5.7 mm. Fibroids: 1-1.7 x 2.1 cm, intramural 2-1.2 x 0.6 cm, intramural 3-1.3 x 0.7 cm-intramural 4-1.9 x 1.8 cm, intramural 5-1.5 x 1.4 cm, subserosal Left ovary: 2.5 x 1.6 x 1.4 cm, 2.7 cm cube.  Normal-appearing. Right ovary: 2.6 x 1.5 x 1.8 cm, 3.6 cm cube.  Normal-appearing. No free fluid. 05/29/23 EMB benign  Birth control: Vasectomy   Last PAP:    Component Value Date/Time   DIAGPAP (A) 05/03/2023 1219    - Atypical squamous cells of undetermined significance (ASC-US)   HPVHIGH Negative 05/03/2023 1219   ADEQPAP  05/03/2023 1219    Satisfactory for evaluation; transformation zone component PRESENT.   GYN HISTORY: No significant history  OB History  Gravida Para Term Preterm AB Living  2 2 2   2   SAB IAB Ectopic Multiple Live Births      2    # Outcome Date GA Lbr Len/2nd Weight Sex Type Anes PTL Lv  2 Term      CS-Unspec   LIV  1 Term      CS-Unspec   LIV    Past Medical History:  Diagnosis Date   Arthritis    GERD (gastroesophageal reflux disease)    Hypertension    Obesity    Osteoarthritis    knee   Sleep apnea     Past Surgical History:  Procedure Laterality Date   CESAREAN SECTION  10/22/1999   12/21/96   CHOLECYSTECTOMY  2001   COLONOSCOPY WITH PROPOFOL N/A 09/22/2021   Procedure: COLONOSCOPY WITH  PROPOFOL;  Surgeon: Jaynie Collins, DO;  Location: [facility] ENDOSCOPY;  Service: Gastroenterology;  Laterality: N/A;   TENDON REPAIR  10/15/2019   Quadriceps muscle rupture    Current Outpatient Medications on File Prior to Visit  Medication Sig Dispense Refill   Cholecalciferol (VITAMIN D3) 50 MCG (2000 UT) CAPS Take 2,000 Units by mouth daily.     famotidine (PEPCID) 20 MG tablet TAKE 1 TABLET(20 MG) BY MOUTH DAILY 90 tablet 3   hydrochlorothiazide (HYDRODIURIL) 25 MG tablet Take 1 tablet (25 mg total) by mouth daily. 90 tablet 3   Multiple Vitamin (MULTIVITAMIN) capsule Take 1 capsule by mouth daily.     Probiotic Product (PROBIOTIC PO) Take 1 tablet by mouth daily.     No current facility-administered medications on file prior to visit.    Allergies  Allergen Reactions   Tramadol Nausea Only and Itching      PE Today's Vitals   07/13/23 0856  BP: 112/72  Pulse: 86  Temp: 98.7 F (37.1 C)  TempSrc: Oral  SpO2: 98%  Weight: 242 lb (109.8 kg)  Height: 5\' 2"  (1.575 m)   Body mass index is 44.26 kg/m.  Physical Exam Vitals reviewed.  Constitutional:      General: She is not in acute distress.    Appearance: Normal  appearance.  HENT:     Head: Normocephalic and atraumatic.     Nose: Nose normal.  Eyes:     Extraocular Movements: Extraocular movements intact.     Conjunctiva/sclera: Conjunctivae normal.  Cardiovascular:     Rate and Rhythm: Normal rate and regular rhythm.     Heart sounds: No murmur heard.    No friction rub. No gallop.  Pulmonary:     Effort: Pulmonary effort is normal. No respiratory distress.     Breath sounds: Normal breath sounds. No stridor. No wheezing, rhonchi or rales.  Musculoskeletal:        General: Normal range of motion.     Cervical back: Normal range of motion.  Neurological:     General: No focal deficit present.     Mental Status: She is alert.  Psychiatric:        Mood and Affect: Mood normal.        Behavior: Behavior  normal.       Assessment and Plan:        Abnormal uterine bleeding (AUB) Assessment & Plan: Plan for diagnostic hysteroscopy, dilation and curettage, NovaSure endometrial ablation.  Discussed outpatient procedure. Reviewed that  recovery is usually 1-2 days. Risks including infections, bleeding, and damage to surrounding organs reviewed. Recommend NPO prior to midnight and reviewed medication to take on day of surgery. Dicussed use of NSAIDS as needed for pain postoperatively.  Preop checklist: Antibiotics: none DVT ppx: SCDs Postop visit: 1 week Additional clearance: Received medical clearance   Orders: -     Ibuprofen; Take 1 tablet (800 mg total) by mouth every 8 (eight) hours as needed for up to 42 doses.  Dispense: 42 tablet; Refill: 0   Rosalyn Gess, MD

## 2023-07-13 NOTE — Progress Notes (Signed)
 PCP - Bethanie Dicker, NP  Chest x-ray - 01/12/23 EKG - DOS  CPAP - Wears every night  Anesthesia review: N  Patient verbally denies any shortness of breath, fever, cough and chest pain during phone call   -------------  SDW INSTRUCTIONS given:  Your procedure is scheduled on Tuesday, February 25th.  Report to [facility] Main Entrance "A" at 0530 A.M., and check in at the Admitting office.  Call this number if you have problems the morning of surgery:  [phone]   Remember:  Do not eat or drink after midnight the night before your surgery     Take these medicines the morning of surgery with A SIP OF WATER  N/A  As of today, STOP taking any Aspirin (unless otherwise instructed by your surgeon) Aleve, Naproxen, Ibuprofen, Motrin, Advil, Goody's, BC's, all herbal medications, fish oil, and all vitamins.                      Do not wear jewelry, make up, or nail polish            Do not wear lotions, powders, perfumes/colognes, or deodorant.            Do not shave 48 hours prior to surgery.  Men may shave face and neck.            Do not bring valuables to the hospital.            [facility] is not responsible for any belongings or valuables.  Do NOT Smoke (Tobacco/Vaping) 24 hours prior to your procedure If you use a CPAP at night, you may bring all equipment for your overnight stay.   Contacts, glasses, dentures or bridgework may not be worn into surgery.      For patients admitted to the hospital, discharge time will be determined by your treatment team.   Patients discharged the day of surgery will not be allowed to drive home, and someone needs to stay with them for 24 hours.    Special instructions:   [place]- Preparing For Surgery  Before surgery, you can play an important role. Because skin is not sterile, your skin needs to be as free of germs as possible. You can reduce the number of germs on your skin by washing with CHG (chlorahexidine gluconate) Soap  before surgery.  CHG is an antiseptic cleaner which kills germs and bonds with the skin to continue killing germs even after washing.    Oral Hygiene is also important to reduce your risk of infection.  Remember - BRUSH YOUR TEETH THE MORNING OF SURGERY WITH YOUR REGULAR TOOTHPASTE  Please do not use if you have an allergy to CHG or antibacterial soaps. If your skin becomes reddened/irritated stop using the CHG.  Do not shave (including legs and underarms) for at least 48 hours prior to first CHG shower. It is OK to shave your face.  Please follow these instructions carefully.   Shower the NIGHT BEFORE SURGERY and the MORNING OF SURGERY with DIAL Soap.   Pat yourself dry with a CLEAN TOWEL.  Wear CLEAN PAJAMAS to bed the night before surgery  Place CLEAN SHEETS on your bed the night of your first shower and DO NOT SLEEP WITH PETS.   Day of Surgery: Please shower morning of surgery  Wear Clean/Comfortable clothing the morning of surgery Do not apply any deodorants/lotions.   Remember to brush your teeth WITH YOUR REGULAR TOOTHPASTE.   Questions  were answered. Patient verbalized understanding of instructions.

## 2023-07-13 NOTE — Assessment & Plan Note (Signed)
 Plan for diagnostic hysteroscopy, dilation and curettage, NovaSure endometrial ablation.  Discussed outpatient procedure. Reviewed that  recovery is usually 1-2 days. Risks including infections, bleeding, and damage to surrounding organs reviewed. Recommend NPO prior to midnight and reviewed medication to take on day of surgery. Dicussed use of NSAIDS as needed for pain postoperatively.  Preop checklist: Antibiotics: none DVT ppx: SCDs Postop visit: 1 week Additional clearance: Received medical clearance

## 2023-07-17 ENCOUNTER — Encounter (HOSPITAL_COMMUNITY): Payer: Self-pay | Admitting: Obstetrics and Gynecology

## 2023-07-17 ENCOUNTER — Other Ambulatory Visit: Payer: Self-pay

## 2023-07-17 ENCOUNTER — Encounter (HOSPITAL_COMMUNITY)
Admission: RE | Disposition: A | Discharge status: Home / Self Care | Payer: Self-pay | Source: Home / Self Care | Attending: Obstetrics and Gynecology

## 2023-07-17 ENCOUNTER — Ambulatory Visit (HOSPITAL_COMMUNITY): Payer: 59 | Admitting: Registered Nurse

## 2023-07-17 ENCOUNTER — Encounter: Payer: Self-pay | Admitting: Nurse Practitioner

## 2023-07-17 ENCOUNTER — Ambulatory Visit (HOSPITAL_COMMUNITY)
Admission: RE | Admit: 2023-07-17 | Discharge: 2023-07-17 | Disposition: A | Discharge status: Home / Self Care | Payer: 59 | Attending: Obstetrics and Gynecology | Admitting: Obstetrics and Gynecology

## 2023-07-17 DIAGNOSIS — N939 Abnormal uterine and vaginal bleeding, unspecified: Secondary | ICD-10-CM | POA: Diagnosis not present

## 2023-07-17 DIAGNOSIS — G4733 Obstructive sleep apnea (adult) (pediatric): Secondary | ICD-10-CM

## 2023-07-17 DIAGNOSIS — I10 Essential (primary) hypertension: Secondary | ICD-10-CM | POA: Diagnosis not present

## 2023-07-17 DIAGNOSIS — G4730 Sleep apnea, unspecified: Secondary | ICD-10-CM | POA: Insufficient documentation

## 2023-07-17 DIAGNOSIS — Z79899 Other long term (current) drug therapy: Secondary | ICD-10-CM | POA: Diagnosis not present

## 2023-07-17 DIAGNOSIS — K219 Gastro-esophageal reflux disease without esophagitis: Secondary | ICD-10-CM | POA: Insufficient documentation

## 2023-07-17 DIAGNOSIS — N840 Polyp of corpus uteri: Secondary | ICD-10-CM | POA: Diagnosis not present

## 2023-07-17 HISTORY — PX: DILITATION & CURRETTAGE/HYSTROSCOPY WITH NOVASURE ABLATION: SHX5568

## 2023-07-17 LAB — CBC
HCT: 37 % (ref 36.0–46.0)
Hemoglobin: 12 g/dL (ref 12.0–15.0)
MCH: 27.6 pg (ref 26.0–34.0)
MCHC: 32.4 g/dL (ref 30.0–36.0)
MCV: 85.3 fL (ref 80.0–100.0)
Platelets: 256 10*3/uL (ref 150–400)
RBC: 4.34 MIL/uL (ref 3.87–5.11)
RDW: 13.7 % (ref 11.5–15.5)
WBC: 6.4 10*3/uL (ref 4.0–10.5)
nRBC: 0 % (ref 0.0–0.2)

## 2023-07-17 LAB — BASIC METABOLIC PANEL
Anion gap: 9 (ref 5–15)
BUN: 17 mg/dL (ref 6–20)
CO2: 28 mmol/L (ref 22–32)
Calcium: 9.1 mg/dL (ref 8.9–10.3)
Chloride: 103 mmol/L (ref 98–111)
Creatinine, Ser: 0.94 mg/dL (ref 0.44–1.00)
GFR, Estimated: >60 mL/min (ref >60)
Glucose, Bld: 103 mg/dL — ABNORMAL HIGH (ref 70–99)
Potassium: 3.5 mmol/L (ref 3.5–5.1)
Sodium: 140 mmol/L (ref 135–145)

## 2023-07-17 LAB — POCT PREGNANCY, URINE: Preg Test, Ur: NEGATIVE

## 2023-07-17 SURGERY — DILATATION & CURETTAGE/HYSTEROSCOPY WITH NOVASURE ABLATION
Anesthesia: Monitor Anesthesia Care | Site: Uterus

## 2023-07-17 MED ORDER — FENTANYL CITRATE (PF) 250 MCG/5ML IJ SOLN
INTRAMUSCULAR | Status: AC
Start: 2023-07-17 — End: ?
  Filled 2023-07-17: qty 5

## 2023-07-17 MED ORDER — SODIUM CHLORIDE 0.9 % IR SOLN
Status: DC | PRN
  Administered 2023-07-17: 1 via INTRAVESICAL

## 2023-07-17 MED ORDER — LIDOCAINE HCL 1 % IJ SOLN
INTRAMUSCULAR | Status: AC
  Filled 2023-07-17: qty 20

## 2023-07-17 MED ORDER — MIDAZOLAM HCL 2 MG/2ML IJ SOLN
INTRAMUSCULAR | Status: AC
  Filled 2023-07-17: qty 2

## 2023-07-17 MED ORDER — LACTATED RINGERS IV SOLN: INTRAVENOUS | Status: DC

## 2023-07-17 MED ORDER — ORAL CARE MOUTH RINSE: 15.0000 mL | Freq: Once | OROMUCOSAL | Status: AC

## 2023-07-17 MED ORDER — ONDANSETRON HCL 4 MG/2ML IJ SOLN
INTRAMUSCULAR | Status: DC | PRN
  Administered 2023-07-17: 4 mg via INTRAVENOUS

## 2023-07-17 MED ORDER — FENTANYL CITRATE (PF) 250 MCG/5ML IJ SOLN
INTRAMUSCULAR | Status: DC | PRN
  Administered 2023-07-17 (×3): 25 ug via INTRAVENOUS

## 2023-07-17 MED ORDER — ACETAMINOPHEN 500 MG PO TABS
1000.0000 mg | ORAL_TABLET | ORAL | Status: DC
  Filled 2023-07-17: qty 2

## 2023-07-17 MED ORDER — PROPOFOL 10 MG/ML IV BOLUS
INTRAVENOUS | Status: AC
  Filled 2023-07-17: qty 20

## 2023-07-17 MED ORDER — FENTANYL CITRATE (PF) 100 MCG/2ML IJ SOLN: 25.0000 ug | INTRAMUSCULAR | Status: DC | PRN

## 2023-07-17 MED ORDER — DEXAMETHASONE SODIUM PHOSPHATE 10 MG/ML IJ SOLN
INTRAMUSCULAR | Status: DC | PRN
  Administered 2023-07-17: 5 mg via INTRAVENOUS

## 2023-07-17 MED ORDER — LIDOCAINE HCL (PF) 1 % IJ SOLN
INTRAMUSCULAR | Status: DC | PRN
  Administered 2023-07-17: 10 mL

## 2023-07-17 MED ORDER — ACETAMINOPHEN 500 MG PO TABS
1000.0000 mg | ORAL_TABLET | Freq: Once | ORAL | Status: AC
  Administered 2023-07-17: 1000 mg via ORAL

## 2023-07-17 MED ORDER — DEXMEDETOMIDINE HCL IN NACL 80 MCG/20ML IV SOLN
INTRAVENOUS | Status: DC | PRN
  Administered 2023-07-17 (×2): 8 ug via INTRAVENOUS

## 2023-07-17 MED ORDER — DROPERIDOL 2.5 MG/ML IJ SOLN: 0.6250 mg | Freq: Once | INTRAMUSCULAR | Status: DC | PRN

## 2023-07-17 MED ORDER — MIDAZOLAM HCL 2 MG/2ML IJ SOLN
INTRAMUSCULAR | Status: DC | PRN
  Administered 2023-07-17: 2 mg via INTRAVENOUS

## 2023-07-17 MED ORDER — PROPOFOL 10 MG/ML IV BOLUS
INTRAVENOUS | Status: DC | PRN
  Administered 2023-07-17: 120 ug/kg/min via INTRAVENOUS

## 2023-07-17 MED ORDER — CHLORHEXIDINE GLUCONATE 0.12 % MT SOLN
15.0000 mL | Freq: Once | OROMUCOSAL | Status: AC
  Administered 2023-07-17: 15 mL via OROMUCOSAL
  Filled 2023-07-17: qty 15

## 2023-07-17 MED ORDER — CELECOXIB 200 MG PO CAPS
200.0000 mg | ORAL_CAPSULE | Freq: Once | ORAL | Status: AC
  Administered 2023-07-17: 200 mg via ORAL
  Filled 2023-07-17: qty 1

## 2023-07-17 SURGICAL SUPPLY — 9 items
ABLATOR SURESOUND NOVASURE (ABLATOR) ×1 IMPLANT
DILATOR CANAL MILEX (MISCELLANEOUS) IMPLANT
GLOVE BIO SURGEON STRL SZ7.5 (GLOVE) ×2 IMPLANT
GLOVE BIOGEL PI IND STRL 7.5 (GLOVE) ×1 IMPLANT
GOWN STRL REUS W/ TWL XL LVL3 (GOWN DISPOSABLE) ×1 IMPLANT
KIT PROCEDURE FLUENT (KITS) ×1 IMPLANT
PACK VAGINAL MINOR WOMEN LF (CUSTOM PROCEDURE TRAY) ×1 IMPLANT
PAD OB MATERNITY 11 LF (PERSONAL CARE ITEMS) ×1 IMPLANT
TOWEL GREEN STERILE FF (TOWEL DISPOSABLE) ×1 IMPLANT

## 2023-07-17 NOTE — Interval H&P Note (Signed)
 History and Physical Interval Note:  07/17/2023 7:33 AM  Brandy Nelson  has presented today for surgery, with the diagnosis of AUB.  The various methods of treatment have been discussed with the patient and family. After consideration of risks, benefits and other options for treatment, the patient has consented to  Procedure(s): DILATATION & CURETTAGE/HYSTEROSCOPY WITH NOVASURE ABLATION (N/A) as a surgical intervention.  The patient's history has been reviewed, patient examined, no change in status, stable for surgery.  I have reviewed the patient's chart and labs.  Questions were answered to the patient's satisfaction.     Zahmir Lalla Lasandra Beech

## 2023-07-17 NOTE — Anesthesia Postprocedure Evaluation (Signed)
 Anesthesia Post Note  Patient: Brandy Nelson  Procedure(s) Performed: DILATATION & CURETTAGE/HYSTEROSCOPY (Uterus)     Patient location during evaluation: PACU Anesthesia Type: MAC Level of consciousness: awake and alert Pain management: pain level controlled Vital Signs Assessment: post-procedure vital signs reviewed and stable Respiratory status: spontaneous breathing, nonlabored ventilation, respiratory function stable and patient connected to nasal cannula oxygen Cardiovascular status: stable and blood pressure returned to baseline Postop Assessment: no apparent nausea or vomiting Anesthetic complications: no   No notable events documented.  Last Vitals:  Vitals:   07/17/23 0900 07/17/23 0905  BP: 126/76 121/74  Pulse: 76 76  Resp: (!) 22 (!) 23  Temp:  36.6 C  SpO2: 98% 99%    Last Pain:  Vitals:   07/17/23 0905  TempSrc:   PainSc: 0-No pain                 Earl Lites P Perina Salvaggio

## 2023-07-17 NOTE — Anesthesia Preprocedure Evaluation (Addendum)
 Anesthesia Evaluation  Patient identified by MRN, date of birth, ID band Patient awake    Reviewed: Allergy & Precautions, NPO status , Patient's Chart, lab work & pertinent test results  Airway Mallampati: II  TM Distance: >3 FB Neck ROM: Full    Dental no notable dental hx.    Pulmonary sleep apnea    Pulmonary exam normal        Cardiovascular hypertension, Pt. on medications  Rhythm:Regular Rate:Normal     Neuro/Psych negative neurological ROS  negative psych ROS   GI/Hepatic Neg liver ROS,GERD  ,,  Endo/Other  negative endocrine ROS    Renal/GU negative Renal ROS  Female GU complaint AUB    Musculoskeletal  (+) Arthritis , Osteoarthritis,    Abdominal  (+) + obese  Peds  Hematology Lab Results      Component                Value               Date                      WBC                      6.4                 07/17/2023                HGB                      12.0                07/17/2023                HCT                      37.0                07/17/2023                MCV                      85.3                07/17/2023                PLT                      256                 07/17/2023              Anesthesia Other Findings   Reproductive/Obstetrics                             Anesthesia Physical Anesthesia Plan  ASA: 3  Anesthesia Plan: MAC   Post-op Pain Management:    Induction: Intravenous  PONV Risk Score and Plan: 2 and Propofol infusion, Treatment may vary due to age or medical condition, Ondansetron, Dexamethasone and Midazolam  Airway Management Planned: Simple Face Mask and Nasal Cannula  Additional Equipment: None  Intra-op Plan:   Post-operative Plan:   Informed Consent: I have reviewed the patients History and Physical, chart, labs and discussed the procedure including the risks, benefits and alternatives for the proposed anesthesia with  the patient or  authorized representative who has indicated his/her understanding and acceptance.     Dental advisory given  Plan Discussed with: CRNA  Anesthesia Plan Comments:        Anesthesia Quick Evaluation

## 2023-07-17 NOTE — Transfer of Care (Signed)
 Immediate Anesthesia Transfer of Care Note  Patient: Brandy Nelson  Procedure(s) Performed: DILATATION & CURETTAGE/HYSTEROSCOPY (Uterus)  Patient Location: PACU  Anesthesia Type:MAC  Level of Consciousness: drowsy and responds to stimulation  Airway & Oxygen Therapy: Patient Spontanous Breathing  Post-op Assessment: Report given to RN and Post -op Vital signs reviewed and stable  Post vital signs: Reviewed and stable  Last Vitals:  Vitals Value Taken Time  BP 119/74 07/17/23 0845  Temp 36.6 C 07/17/23 0845  Pulse 85 07/17/23 0848  Resp 23 07/17/23 0848  SpO2 96 % 07/17/23 0848  Vitals shown include unfiled device data.  Last Pain:  Vitals:   07/17/23 0845  TempSrc:   PainSc: Asleep         Complications: No notable events documented.

## 2023-07-17 NOTE — Op Note (Signed)
 07/17/2023 Sharlette Dense [id number]  OPERATIVE REPORT  Preop Diagnosis: Abnormal uterine bleeding  Post operative diagnosis: same  Procedure: Diagnostic hysteroscopy, dilation and curettage  Surgeon: Rosalyn Gess, MD Assistant: none  Anesthesia: MAC Fluids: please see anesthesia report Fluid deficit: 285cc  Complications: None  Findings: Normal cervix and uterine cavity measuring 9cm.  Estimated blood loss: Minimal  Specimens:  ID Type Source Tests Collected by Time Destination  1 : Endometrial Curettings Tissue PATH Gyn biopsy SURGICAL PATHOLOGY Rosalyn Gess, MD 07/17/2023 [id number]     Disposition of specimen: Pathology    Procedure: Patient was taken to the OR where she was placed in dorsal lithotomy in stirrups. She voided prior to transport. SCDs were in place.  The patient was prepped and draped in the usual sterile fashion. An adequate timeout was obtained and everyone agreed. A bivalve speculum was placed inside the vagina and the cervix visualized. The cervix was grasped anteriorly with a single-tooth tenaculum. Paracervical block was performed with 1% lidocaine.  The uterus sounded to 9 cm. Sequential cervical dilation was done to 42fr, and the hysteroscope was introduced into the uterine cavity. The cervix and endometrial lining appeared normal. Findings as noted. A sharp curettage was then performed to ensure complete sampling of the cavity and was sent to pathology.   The cervix was further dilated to pass the Novasure device. Cervical length measured 4cm. The cavity length was set to 5cm. The Novasure device was then inserted into the endometrial cavity, however the width of the device remained <2cm. Multiple maneuvers were attempted to allow for expansion of the array but were unsuccessful. The device was also reinserted with cavity lengths of 4cm and 4.5cm without appropriate expansion of the array. Uterine length was confirmed. The procedure was aborted at this  time per manufacturer's guidelines. The hysteroscopy was inserted for final observation of endometrium, which was hemostatic.  All instrument, sponge and lap counts were correct x2. The patient was awakened taken to recovery room in stable condition.  Rosalyn Gess MD 07/17/2023 8:46 AM

## 2023-07-18 ENCOUNTER — Encounter (HOSPITAL_COMMUNITY): Payer: Self-pay | Admitting: Obstetrics and Gynecology

## 2023-07-18 LAB — SURGICAL PATHOLOGY

## 2023-07-23 ENCOUNTER — Encounter: Payer: Self-pay | Admitting: Obstetrics and Gynecology

## 2023-07-24 ENCOUNTER — Other Ambulatory Visit: Payer: Self-pay

## 2023-07-30 ENCOUNTER — Ambulatory Visit (INDEPENDENT_AMBULATORY_CARE_PROVIDER_SITE_OTHER): Payer: 59 | Admitting: Obstetrics and Gynecology

## 2023-07-30 ENCOUNTER — Encounter: Payer: Self-pay | Admitting: Obstetrics and Gynecology

## 2023-07-30 VITALS — BP 104/76 | HR 83 | Temp 98.9°F | Wt 246.0 lb

## 2023-07-30 DIAGNOSIS — D259 Leiomyoma of uterus, unspecified: Secondary | ICD-10-CM

## 2023-07-30 DIAGNOSIS — N939 Abnormal uterine and vaginal bleeding, unspecified: Secondary | ICD-10-CM

## 2023-07-30 NOTE — Progress Notes (Signed)
 53 y.o. [id number] perimenopausal female with AUB-F, HTN, morbid obesity, GERD, prior CDx1 here for postoperative visit for diagnostic hysteroscopy, D&C, aborted Novasure endometrial ablation on 07/17/23. 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   Patient's last menstrual period was 07/16/2023.   Patient was seen 05/29/23 and reported skipping months.  Also notes HVB, cycles used to be 3d, now 7d with clots. Today, she states she is doing well. Denies N/V, abdominal pain, VB, dysuria. Normal BM and voids.  05/03/2023 workup with normal FSH, TSH, CBC, hCG TVUS 05/29/23: Uterus: 9.5 x 6.3 x 5.4 cm, anteverted with multiple fibroids. Endometrial thickness: 5.7 mm. Fibroids: 1-1.7 x 2.1 cm, intramural 2-1.2 x 0.6 cm, intramural 3-1.3 x 0.7 cm-intramural 4-1.9 x 1.8 cm, intramural 5-1.5 x 1.4 cm, subserosal Left ovary: 2.5 x 1.6 x 1.4 cm, 2.7 cm cube.  Normal-appearing. Right ovary: 2.6 x 1.5 x 1.8 cm, 3.6 cm cube.  Normal-appearing. No free fluid. 05/29/23 EMB benign  07/17/2023 pathology: A. ENDOMETRIUM, CURETTAGE:  - Benign endometrioid polyp  - Scant nonpolypoid benign inactive endometrium  - Negative for hyperplasia or malignancy   Birth control: Vasectomy   Last PAP:    Component Value Date/Time   DIAGPAP (A) 05/03/2023 1219    - Atypical squamous cells of undetermined significance (ASC-US)   HPVHIGH Negative 05/03/2023 1219   ADEQPAP  05/03/2023 1219    Satisfactory for evaluation; transformation zone component PRESENT.   GYN HISTORY: No significant history  OB History  Gravida Para Term Preterm AB Living  2 2 2   2   SAB IAB Ectopic Multiple Live Births      2    # Outcome Date GA Lbr Len/2nd Weight Sex Type Anes PTL Lv  2 Term      CS-Unspec   LIV  1 Term      CS-Unspec   LIV    Past Medical History:  Diagnosis Date   Arthritis    GERD (gastroesophageal reflux disease)    Hypertension    Obesity    Osteoarthritis    knee    Sleep apnea     Past Surgical History:  Procedure Laterality Date   CESAREAN SECTION  10/22/1999   12/21/96   CHOLECYSTECTOMY  2001   COLONOSCOPY WITH PROPOFOL N/A 09/22/2021   Procedure: COLONOSCOPY WITH PROPOFOL;  Surgeon: Jaynie Collins, DO;  Location: [facility] ENDOSCOPY;  Service: Gastroenterology;  Laterality: N/A;   DILITATION & CURRETTAGE/HYSTROSCOPY WITH NOVASURE ABLATION N/A 07/17/2023   Procedure: DILATATION & CURETTAGE/HYSTEROSCOPY;  Surgeon: Rosalyn Gess, MD;  Location: [facility] OR;  Service: Gynecology;  Laterality: N/A;   TENDON REPAIR  10/15/2019   Quadriceps muscle rupture    Current Outpatient Medications on File Prior to Visit  Medication Sig Dispense Refill   Cholecalciferol (VITAMIN D3) 50 MCG (2000 UT) CAPS Take 2,000 Units by mouth daily.     hydrochlorothiazide (HYDRODIURIL) 25 MG tablet Take 1 tablet (25 mg total) by mouth daily. 90 tablet 3   ibuprofen (ADVIL) 800 MG tablet Take 1 tablet (800 mg total) by mouth every 8 (eight) hours as needed for up to 42 doses. 42 tablet 0   Multiple Vitamin (MULTIVITAMIN) capsule Take 1 capsule by mouth daily.     Probiotic Product (PROBIOTIC PO) Take 1 tablet by mouth daily.     No current facility-administered medications on file prior to visit.    Allergies  Allergen Reactions   Tramadol Nausea Only and Itching  PE Today's Vitals   07/30/23 0856  BP: 104/76  Pulse: 83  Temp: 98.9 F (37.2 C)  TempSrc: Oral  SpO2: 97%  Weight: 246 lb (111.6 kg)    Body mass index is 44.99 kg/m.  Physical Exam Vitals reviewed.  Constitutional:      General: She is not in acute distress.    Appearance: Normal appearance.  HENT:     Head: Normocephalic and atraumatic.     Nose: Nose normal.  Eyes:     Extraocular Movements: Extraocular movements intact.     Conjunctiva/sclera: Conjunctivae normal.  Pulmonary:     Effort: Pulmonary effort is normal.  Abdominal:     General: There is no distension.     Palpations:  Abdomen is soft.     Tenderness: There is no abdominal tenderness.  Musculoskeletal:        General: Normal range of motion.     Cervical back: Normal range of motion.  Neurological:     General: No focal deficit present.     Mental Status: She is alert.  Psychiatric:        Mood and Affect: Mood normal.        Behavior: Behavior normal.       Assessment and Plan:        Abnormal uterine bleeding (AUB) Assessment & Plan: AUB in setting of fibroids and anovulation (perimenopause)  Work-up to date: 05/03/2023 workup with normal FSH, TSH, CBC, hCG TVUS 05/29/23: Uterus: 9.5 x 6.3 x 5.4 cm, anteverted with multiple fibroids. Endometrial thickness: 5.7 mm. Fibroids: 1-1.7 x 2.1 cm, intramural 2-1.2 x 0.6 cm, intramural 3-1.3 x 0.7 cm-intramural 4-1.9 x 1.8 cm, intramural 5-1.5 x 1.4 cm, subserosal Left ovary: 2.5 x 1.6 x 1.4 cm, 2.7 cm cube.  Normal-appearing. Right ovary: 2.6 x 1.5 x 1.8 cm, 3.6 cm cube.  Normal-appearing. No free fluid. 05/29/23 EMB benign  07/17/2023 pathology: A. ENDOMETRIUM, CURETTAGE:  - Benign endometrioid polyp  - Scant nonpolypoid benign inactive endometrium  - Negative for hyperplasia or malignancy   Discussed alternative treatment options given aborted NovaSure endometrial ablation (array would not expand), including tranexamic acid, cyclical progestin, HRT, hysterectomy. Patient declines Mirena IUD.  Wants minimal medical therapy. Will call when she has decided.   Uterine leiomyoma, unspecified location  As above  Rosalyn Gess, MD

## 2023-07-30 NOTE — Patient Instructions (Signed)
 Consider the following options for management of abnormal uterine bleeding: Tranexamic acid, cyclical progestin (like Aygestin), hormone replacement therapy (estrogen plus progesterone), hysterectomy.

## 2023-07-30 NOTE — Assessment & Plan Note (Addendum)
 AUB in setting of fibroids and anovulation (perimenopause)  Work-up to date: 05/03/2023 workup with normal FSH, TSH, CBC, hCG TVUS 05/29/23: Uterus: 9.5 x 6.3 x 5.4 cm, anteverted with multiple fibroids. Endometrial thickness: 5.7 mm. Fibroids: 1-1.7 x 2.1 cm, intramural 2-1.2 x 0.6 cm, intramural 3-1.3 x 0.7 cm-intramural 4-1.9 x 1.8 cm, intramural 5-1.5 x 1.4 cm, subserosal Left ovary: 2.5 x 1.6 x 1.4 cm, 2.7 cm cube.  Normal-appearing. Right ovary: 2.6 x 1.5 x 1.8 cm, 3.6 cm cube.  Normal-appearing. No free fluid. 05/29/23 EMB benign  07/17/2023 pathology: A. ENDOMETRIUM, CURETTAGE:  - Benign endometrioid polyp  - Scant nonpolypoid benign inactive endometrium  - Negative for hyperplasia or malignancy   Discussed alternative treatment options given aborted NovaSure endometrial ablation (array would not expand), including tranexamic acid, cyclical progestin, HRT, hysterectomy. Patient declines Mirena IUD.  Wants minimal medical therapy. Will call when she has decided.

## 2023-08-13 DIAGNOSIS — G4733 Obstructive sleep apnea (adult) (pediatric): Secondary | ICD-10-CM | POA: Diagnosis not present

## 2023-09-19 ENCOUNTER — Other Ambulatory Visit: Payer: Self-pay | Admitting: Nurse Practitioner

## 2023-09-19 DIAGNOSIS — I10 Essential (primary) hypertension: Secondary | ICD-10-CM

## 2023-10-04 ENCOUNTER — Other Ambulatory Visit: Payer: Self-pay | Admitting: Nurse Practitioner

## 2023-10-04 DIAGNOSIS — Z1231 Encounter for screening mammogram for malignant neoplasm of breast: Secondary | ICD-10-CM

## 2023-10-17 ENCOUNTER — Ambulatory Visit
Admission: RE | Admit: 2023-10-17 | Discharge: 2023-10-17 | Disposition: A | Discharge status: Home / Self Care | Source: Ambulatory Visit | Attending: Nurse Practitioner | Admitting: Nurse Practitioner

## 2023-10-17 DIAGNOSIS — Z1231 Encounter for screening mammogram for malignant neoplasm of breast: Secondary | ICD-10-CM | POA: Insufficient documentation

## 2023-10-19 ENCOUNTER — Ambulatory Visit: Payer: Self-pay | Admitting: Nurse Practitioner

## 2023-11-12 DIAGNOSIS — G4733 Obstructive sleep apnea (adult) (pediatric): Secondary | ICD-10-CM | POA: Diagnosis not present

## 2023-11-21 DIAGNOSIS — G4733 Obstructive sleep apnea (adult) (pediatric): Secondary | ICD-10-CM | POA: Diagnosis not present

## 2024-01-08 ENCOUNTER — Telehealth: Payer: Self-pay

## 2024-01-08 NOTE — Telephone Encounter (Signed)
 Copied from CRM #[id number]. Topic: Clinical - Request for Lab/Test Order >> Jan 08, 2024 10:43 AM Suzen RAMAN wrote: Reason for CRM: Patient would like to no know if an XRAY can be order to rule out TB. Patient previously had skin tests completed that came back (+) and then an XRAY is ordered to confirm. Patient would like to avoid skin test and just complete x-ray. Please contact patient to further advise.  CB#[phone]

## 2024-01-11 ENCOUNTER — Other Ambulatory Visit: Payer: Self-pay | Admitting: Nurse Practitioner

## 2024-01-11 DIAGNOSIS — R7612 Nonspecific reaction to cell mediated immunity measurement of gamma interferon antigen response without active tuberculosis: Secondary | ICD-10-CM

## 2024-01-14 ENCOUNTER — Other Ambulatory Visit

## 2024-01-14 ENCOUNTER — Ambulatory Visit

## 2024-01-14 DIAGNOSIS — R7611 Nonspecific reaction to tuberculin skin test without active tuberculosis: Secondary | ICD-10-CM | POA: Diagnosis not present

## 2024-01-14 DIAGNOSIS — R7612 Nonspecific reaction to cell mediated immunity measurement of gamma interferon antigen response without active tuberculosis: Secondary | ICD-10-CM | POA: Diagnosis not present

## 2024-01-22 ENCOUNTER — Ambulatory Visit: Payer: Self-pay | Admitting: Nurse Practitioner

## 2024-01-23 ENCOUNTER — Ambulatory Visit: Admitting: Internal Medicine

## 2024-01-23 ENCOUNTER — Encounter: Payer: Self-pay | Admitting: Internal Medicine

## 2024-01-23 VITALS — BP 118/78 | HR 81 | Temp 98.3°F | Ht 62.0 in | Wt 249.2 lb

## 2024-01-23 DIAGNOSIS — I10 Essential (primary) hypertension: Secondary | ICD-10-CM

## 2024-01-23 DIAGNOSIS — H93A3 Pulsatile tinnitus, bilateral: Secondary | ICD-10-CM | POA: Diagnosis not present

## 2024-01-23 NOTE — Assessment & Plan Note (Signed)
-   This problem is chronic and stable -Patient blood pressure well-controlled today at 118/78 -Patient states that she has been checking her blood pressure at home since the 10 days started but it has been within normal limits -Will continue with HCTZ 25 mg daily -No further workup at this time

## 2024-01-23 NOTE — Assessment & Plan Note (Signed)
-   Patient states that she developed a whooshing noises in both ears over the last week -This sounds were symmetrical to head down -She denies any associated ear discharge, vertigo, hearing loss, sinus congestion or fevers or chills -She does complain of some mild intermittent ear pain approximately 2 out of 10 -On exam, her tympanic membranes bilaterally had good light reflex and no erythema or bulging.  No cerumen noted in either ear -Patient with pulsatile tinnitus of uncertain etiology. -Would obtain MRI/MRV brain to rule out aneurysm possible etiology even though this is less likely -If imaging is negative and she has persistent symptoms would consider referral to ENT -No further workup at this time

## 2024-01-23 NOTE — Progress Notes (Signed)
 Acute Office Visit  Subjective:     Patient ID: Brandy Nelson, female    DOB: 29-Jan-1971, 53 y.o.   MRN: [id number]  Chief Complaint  Patient presents with   Acute Visit    Bilateral ears make whooshing sound or ocean wave sound when moving around, Itchy on the inside of the ears and intermittent pain 2/10  x 1 week Pressure when swallowing     Discussed the use of AI scribe software for clinical note transcription with the patient, who gave verbal consent to proceed.  History of Present Illness Brandy Nelson is a 53 year old female who presents with bilateral ear whooshing sounds and intermittent light pain.  Auditory disturbance and otalgia - Intermittent bilateral whooshing sounds in both ears for approximately one week - Whooshing sounds intensify when holding head down - Symptoms are mildly improved compared to initial onset - Intermittent light pain in both ears, rated 2 out of 10 - No ear discharge.  No vertigo.  No jaw pain.  No hearing loss.  Associated upper respiratory and systemic symptoms - No cough, cold, fever, chills, sinus pressure, congestion, or postnasal drip - No recent illnesses in her surroundings  Medication and allergy history - No new medications started recently - No use of over-the-counter medications - No known allergies, including seasonal allergies  Relevant medical history - Hypertension managed with hydrochlorothiazide  - History of reflux managed with Pepcid     Review of Systems  Constitutional: Negative.  Negative for chills, fever and malaise/fatigue.  HENT:  Positive for tinnitus. Negative for congestion, ear discharge, ear pain, hearing loss, sinus pain and sore throat.   Respiratory: Negative.    Cardiovascular: Negative.   Gastrointestinal: Negative.   Musculoskeletal: Negative.   Neurological: Negative.   Psychiatric/Behavioral: Negative.          Objective:    BP 118/78   Pulse 81   Temp 98.3 F (36.8 C)   Ht 5'  2 (1.575 m)   Wt 249 lb 3.2 oz (113 kg)   SpO2 98%   BMI 45.58 kg/m    Physical Exam Constitutional:      Appearance: Normal appearance.  HENT:     Head: Atraumatic.     Right Ear: Tympanic membrane, ear canal and external ear normal. There is no impacted cerumen.     Left Ear: Tympanic membrane and external ear normal. There is no impacted cerumen.     Nose:     Right Sinus: No maxillary sinus tenderness or frontal sinus tenderness.     Left Sinus: No maxillary sinus tenderness or frontal sinus tenderness.     Mouth/Throat:     Pharynx: Oropharynx is clear. No oropharyngeal exudate or posterior oropharyngeal erythema.  Cardiovascular:     Rate and Rhythm: Normal rate and regular rhythm.     Heart sounds: Normal heart sounds.  Pulmonary:     Breath sounds: Normal breath sounds. No wheezing or rales.  Abdominal:     General: Bowel sounds are normal. There is no distension.     Palpations: Abdomen is soft.     Tenderness: There is no abdominal tenderness.  Neurological:     General: No focal deficit present.     Mental Status: She is alert and oriented to person, place, and time.  Psychiatric:        Mood and Affect: Mood normal.        Behavior: Behavior normal.     No results found  for any visits on 01/23/24.      Assessment & Plan:   Problem List Items Addressed This Visit       Cardiovascular and Mediastinum   Hypertension   - This problem is chronic and stable -Patient blood pressure well-controlled today at 118/78 -Patient states that she has been checking her blood pressure at home since the 10 days started but it has been within normal limits -Will continue with HCTZ 25 mg daily -No further workup at this time        Nervous and Auditory   Pulsatile tinnitus of both ears - Primary   - Patient states that she developed a whooshing noises in both ears over the last week -This sounds were symmetrical to head down -She denies any associated ear  discharge, vertigo, hearing loss, sinus congestion or fevers or chills -She does complain of some mild intermittent ear pain approximately 2 out of 10 -On exam, her tympanic membranes bilaterally had good light reflex and no erythema or bulging.  No cerumen noted in either ear -Patient with pulsatile tinnitus of uncertain etiology. -Would obtain MRI/MRV brain to rule out aneurysm possible etiology even though this is less likely -If imaging is negative and she has persistent symptoms would consider referral to ENT -No further workup at this time      Relevant Orders   MR MRV HEAD W WO CONTRAST    No orders of the defined types were placed in this encounter.   No follow-ups on file.  [place] Silveria, MD

## 2024-01-23 NOTE — Patient Instructions (Signed)
-   It was a pleasure meeting you today -I am uncertainas to the reason why you are having the whooshing noises in your ears.  There are many possible causes for this. -However, we will obtain imaging of your brain for further evaluation -If this is normal and the whooshing noises persist I will refer you to ENT for further evaluation -Your blood pressure is well-controlled today.  Continue with your hydrochlorothiazide . -Please contact us  with any questions or concerns or if you need any refills

## 2024-02-13 DIAGNOSIS — G4733 Obstructive sleep apnea (adult) (pediatric): Secondary | ICD-10-CM | POA: Diagnosis not present

## 2024-04-09 ENCOUNTER — Telehealth: Payer: Self-pay

## 2024-04-09 NOTE — Telephone Encounter (Signed)
 Pulsating head aches that are not constant denies SOB and chest pain states she has acid reflux and that she has a wrist BP cuff that stated she was 153/90 at one point. I informed the pt that we could schedule her with our NP charanpreet Vincente on Friday at 4pm she stated she will just go to an Urgent care    I spoke with PCP who has been made aware of pts decision

## 2024-04-09 NOTE — Telephone Encounter (Signed)
 Copied from CRM #[id number]. Topic: Clinical - Medical Advice >> Apr 09, 2024  3:29 PM Lonell PEDLAR wrote: Reason for CRM:  Patient called requesting to have her BP checked. no symptoms wrist BP cuff 154/100 unsure if it's accurate.  Advised pt she must come in for OV to review with her provider, but schedule is booking too far out. Patient is frusterated sating she would like to check her BP, as high BP is the #1 silent killer. Please contact pt to advise   ----------------------------------------------------------------------- From previous Reason for Contact - Scheduling: Patient/patient representative is calling to schedule an appointment. Refer to attachments for appointment information.

## 2024-04-22 ENCOUNTER — Encounter: Payer: Self-pay | Admitting: Emergency Medicine

## 2024-04-22 ENCOUNTER — Ambulatory Visit
Admission: EM | Admit: 2024-04-22 | Discharge: 2024-04-22 | Disposition: A | Discharge status: Home / Self Care | Attending: Emergency Medicine | Admitting: Emergency Medicine

## 2024-04-22 DIAGNOSIS — R21 Rash and other nonspecific skin eruption: Secondary | ICD-10-CM | POA: Diagnosis not present

## 2024-04-22 MED ORDER — PREDNISONE 10 MG (21) PO TBPK: ORAL_TABLET | Freq: Every day | ORAL | 0 refills | Status: AC

## 2024-04-22 MED ORDER — DEXAMETHASONE SOD PHOSPHATE PF 10 MG/ML IJ SOLN
10.0000 mg | Freq: Once | INTRAMUSCULAR | Status: AC
  Administered 2024-04-22: 10 mg via INTRAMUSCULAR

## 2024-04-22 NOTE — ED Triage Notes (Signed)
 Patient reports bumps all over, itching x 6 days. Patient has taken benadryl with relief. Last dose Saturday night. Patient states she could not continue to take benadryl because it stays in her system to long.

## 2024-04-22 NOTE — ED Provider Notes (Signed)
 [facility]    CSN: [id number] Arrival date & time: 04/22/24  0940      History   Chief Complaint Chief Complaint  Patient presents with   Rash    HPI Brandy Nelson is a 53 y.o. female.   Patient presents for evaluation of a pruritic rash generalized to the body beginning 6 days ago.  First noticed to the left side of the neck and then areas spread.  Has attempted use of Benadryl but unable to take frequently due to drowsiness.  Denies changes in diet, toiletries or medications.  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  Denies drainage or fever.     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 09/09/2020   Hypertension 09/09/2020   BMI 45.0-49.9, adult (HCC) 10/02/2019    Past Surgical History:  Procedure Laterality Date   CESAREAN SECTION  10/22/1999   12/21/96   CHOLECYSTECTOMY  2001   COLONOSCOPY WITH PROPOFOL  N/A 09/22/2021   Procedure: COLONOSCOPY WITH PROPOFOL ;  Surgeon: Onita Elspeth Sharper, DO;  Location: [facility] ENDOSCOPY;  Service: Gastroenterology;  Laterality: N/A;   DILITATION & CURRETTAGE/HYSTROSCOPY WITH NOVASURE ABLATION N/A 07/17/2023   Procedure: DILATATION & CURETTAGE/HYSTEROSCOPY;  Surgeon: Dallie Vera GAILS, MD;  Location: [facility] OR;  Service: Gynecology;  Laterality: N/A;   TENDON REPAIR   10/15/2019   Quadriceps muscle rupture    OB History     Gravida  2   Para  2   Term  2   Preterm      AB      Living  2      SAB      IAB      Ectopic      Multiple      Live Births  2            Home Medications    Prior to Admission medications   Medication Sig Start Date End Date Taking? Authorizing Provider  Cholecalciferol (VITAMIN D3) 50 MCG (2000 UT) CAPS Take 2,000 Units by mouth daily.    [provider]  famotidine  (PEPCID ) 20 MG tablet Take 20 mg by mouth daily. 01/13/24   [provider]  hydrochlorothiazide  (HYDRODIURIL ) 25 MG tablet TAKE 1 TABLET(25 MG) BY MOUTH DAILY 09/19/23   Gretel App, NP  ibuprofen  (ADVIL ) 800 MG tablet Take 1 tablet (800 mg total) by mouth every 8 (eight) hours as needed for up to 42 doses. 07/13/23   Dallie Vera GAILS, MD  Multiple Vitamin (MULTIVITAMIN) capsule Take 1 capsule by mouth daily.    [provider]  Probiotic Product (PROBIOTIC PO) Take 1 tablet by mouth daily.    [provider]    Family History Family History  Problem Relation Age of Onset   Sarcoidosis Mother    Healthy Father  Hypertension Maternal Aunt    Hypertension Maternal Grandmother    Tuberculosis Maternal Grandfather     Social History Social History   Tobacco Use   Smoking status: Never   Smokeless tobacco: Never  Vaping Use   Vaping status: Never Used  Substance Use Topics   Alcohol use: Yes    Comment: occasional   Drug use: Not Currently     Allergies   Tramadol   Review of Systems Review of Systems  Skin:  Positive for rash.     Physical Exam Triage Vital Signs ED Triage Vitals  Encounter Vitals Group     BP 04/22/24 1039 128/76     Girls Systolic BP Percentile --      Girls Diastolic BP Percentile --      Boys Systolic BP Percentile --      Boys Diastolic BP Percentile --      Pulse Rate 04/22/24 1039 73     Resp 04/22/24 1039 20     Temp 04/22/24 1039 98.8 F (37.1  C)     Temp Source 04/22/24 1039 Oral     SpO2 04/22/24 1039 98 %     Weight --      Height --      Head Circumference --      Peak Flow --      Pain Score 04/22/24 1042 0     Pain Loc --      Pain Education --      Exclude from Growth Chart --    No data found.  Updated Vital Signs BP 128/76 (BP Location: Left Arm)   Pulse 73   Temp 98.8 F (37.1 C) (Oral)   Resp 20   SpO2 98%   Visual Acuity Right Eye Distance:   Left Eye Distance:   Bilateral Distance:    Right Eye Near:   Left Eye Near:    Bilateral Near:     Physical Exam Constitutional:      Appearance: Normal appearance.  Eyes:     Extraocular Movements: Extraocular movements intact.  Pulmonary:     Effort: Pulmonary effort is normal.  Skin:    Comments: Ecchymotic patch present to the left index finger at the middle phalanx, eczema patch present to the posterior of the left elbow, scattered fine flesh tone papular rash over the upp extremities, trunk and neck   Neurological:     Mental Status: She is alert and oriented to person, place, and time.      UC Treatments / Results  Labs (all labs ordered are listed, but only abnormal results are displayed) Labs Reviewed - No data to display  EKG   Radiology No results found.  Procedures Procedures (including critical care time)  Medications Ordered in UC Medications - No data to display  Initial Impression / Assessment and Plan / UC Course  I have reviewed the triage vital signs and the nursing notes.  Pertinent labs & imaging results that were available during my care of the patient were reviewed by me and considered in my medical decision making (see chart for details).  Rash  Unknown etiology, appears inflammatory without signs of infection, discussed this with patient, Decadron  IM given and prescribed oral prednisone for home use recommended supportive care and over-the-counter medications for management of pruritus and advised follow-up  with urgent care as needed Final Clinical Impressions(s) / UC Diagnoses   Final diagnoses:  None   Discharge Instructions   None  ED Prescriptions   None    PDMP not reviewed this encounter.   Teresa Shelba SAUNDERS, NP 04/22/24 [phone]

## 2024-04-22 NOTE — Discharge Instructions (Addendum)
 Today you are being treated for the rash which appears inflammatory without signs of infection, area on the arm into the finger appears to be eczema which is a inflammatory rash that can cause dryness flaking and itching  You have been given an injection of steroids today in the office today to help reduce the inflammatory process that occurs with this rash which will help minimize your itching as well as begin to clear  Starting tomorrow take prednisone every morning with food as directed, to continue the above process  You may continue use of topical calamine or Benadryl cream to help manage itching, you may also continue oral Benadryl  Please avoid long exposures to heat such as a hot steamy shower or being outside as this may cause further irritation to your rash  If your symptoms continue to persist or recur please follow-up with dermatology, information listed on front page

## 2024-04-24 ENCOUNTER — Encounter: Payer: 59 | Admitting: Nurse Practitioner

## 2024-05-02 ENCOUNTER — Encounter: Admitting: Nurse Practitioner

## 2024-05-13 ENCOUNTER — Telehealth: Payer: Self-pay

## 2024-05-13 NOTE — Telephone Encounter (Signed)
 Copied from CRM [id number]. Topic: Clinical - Medical Advice >> May 12, 2024  3:16 PM Nessti S wrote: Reason for CRM: has itchy bumps on neck and was gave 6 day predniSONE  (STERAPRED UNI-PAK 21 TAB) 10 MG (21) TBPK tablet then once med was gone bumps came and still itches. She would like to speak with nurse and see if she is able to get prescribed predniSONE  (STERAPRED UNI-PAK 21 TAB) 10 MG (21) TBPK tablet.

## 2024-05-13 NOTE — Telephone Encounter (Signed)
 Called pt to inform her that she would need an appt due to her being seen at an UC on 04-22-24 she would need to be reevaluated. Tried to offer pt an appt in our office as earliest as tomorrow and pt stated she has to work. I informed pt that if scheduling is a conflict then we do encourage pt return to UC or a walk in clinic. Pt verbalized understanding.

## 2024-05-25 ENCOUNTER — Encounter: Payer: Self-pay | Admitting: Emergency Medicine

## 2024-05-25 ENCOUNTER — Ambulatory Visit
Admission: EM | Admit: 2024-05-25 | Discharge: 2024-05-25 | Disposition: A | Discharge status: Home / Self Care | Attending: Emergency Medicine | Admitting: Emergency Medicine

## 2024-05-25 DIAGNOSIS — L2082 Flexural eczema: Secondary | ICD-10-CM

## 2024-05-25 MED ORDER — TRIAMCINOLONE ACETONIDE 0.025 % EX CREA: TOPICAL_CREAM | Freq: Two times a day (BID) | CUTANEOUS | 1 refills | Status: AC

## 2024-05-25 NOTE — ED Provider Notes (Signed)
 " [facility]    CSN: [id number] Arrival date & time: 05/25/24  9078      History   Chief Complaint Chief Complaint  Patient presents with   Rash    HPI Brandy Nelson is a 54 y.o. female.   54 year old female, Brandy Nelson, presents to urgent care for evaluation of recurrent itchy bumps on her neck and left arm.  Patient was seen on 04/22/2024 for same and did 6-day course of prednisone , patient is try to get in with dermatology,has appt March,has CPE with PCP this month. Hs tried Benadryl and benadryl cream but makes pt sleepy.  Patient denies any new soaps, detergents, lotions or foods.  The history is provided by the patient. No language interpreter was used.  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   Musculoskeletal chest pain 05/30/2023   Perimenopause 05/03/2023   Abnormal uterine bleeding (AUB) 05/03/2023   Well woman exam with routine gynecological exam 05/03/2023   Uterine leiomyoma 05/03/2023   Preventative health care 04/24/2023   Menorrhagia with regular cycle 04/24/2023   Gastroesophageal reflux disease 09/21/2022   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 09/09/2020   Hypertension 09/09/2020   BMI 45.0-49.9, adult (HCC) 10/02/2019    Past Surgical History:  Procedure Laterality Date   CESAREAN SECTION  10/22/1999   12/21/96   CHOLECYSTECTOMY  2001   COLONOSCOPY WITH PROPOFOL  N/A 09/22/2021   Procedure: COLONOSCOPY WITH PROPOFOL ;  Surgeon: Onita Elspeth Sharper, DO;  Location: [facility] ENDOSCOPY;  Service: Gastroenterology;  Laterality: N/A;   DILITATION & CURRETTAGE/HYSTROSCOPY WITH NOVASURE ABLATION N/A 07/17/2023    Procedure: DILATATION & CURETTAGE/HYSTEROSCOPY;  Surgeon: Dallie Vera GAILS, MD;  Location: [facility] OR;  Service: Gynecology;  Laterality: N/A;   TENDON REPAIR  10/15/2019   Quadriceps muscle rupture    OB History     Gravida  2   Para  2   Term  2   Preterm      AB      Living  2      SAB      IAB      Ectopic      Multiple      Live Births  2            Home Medications    Prior to Admission medications  Medication Sig Start Date End Date Taking? Authorizing Provider  triamcinolone  (KENALOG ) 0.025 % cream Apply topically 2 (two) times daily. Apply to affected areas,avoid face 05/25/24  Yes Keian Odriscoll, Rilla, NP  Cholecalciferol (VITAMIN D3) 50 MCG (2000 UT) CAPS Take 2,000 Units by mouth daily.    [provider]  famotidine  (PEPCID ) 20 MG tablet Take 20 mg by mouth daily. 01/13/24   [provider]  hydrochlorothiazide  (HYDRODIURIL ) 25 MG tablet TAKE 1 TABLET(25 MG) BY MOUTH DAILY 09/19/23   Gretel App, NP  ibuprofen  (ADVIL ) 800 MG tablet Take 1 tablet (800 mg total) by mouth every 8 (eight) hours as needed for up to 42 doses. 07/13/23   Dallie Vera GAILS, MD  Multiple Vitamin (MULTIVITAMIN) capsule Take 1 capsule by  mouth daily.    [provider]  predniSONE  (STERAPRED UNI-PAK 21 TAB) 10 MG (21) TBPK tablet Take by mouth daily. Take 6 tabs by mouth daily  for 1 days, then 5 tabs for 1 days, then 4 tabs for 1 days, then 3 tabs for 1 days, 2 tabs for 1 days, then 1 tab by mouth daily for 1 days 04/22/24   Teresa Shelba SAUNDERS, NP  Probiotic Product (PROBIOTIC PO) Take 1 tablet by mouth daily.    [provider]    Family History Family History  Problem Relation Age of Onset   Sarcoidosis Mother    Healthy Father    Hypertension Maternal Aunt    Hypertension Maternal Grandmother    Tuberculosis Maternal Grandfather     Social History Social History[1]   Allergies   Tramadol   Review of Systems Review of Systems   Constitutional:  Negative for fever.  Skin:  Positive for color change and rash.  All other systems reviewed and are negative.    Physical Exam Triage Vital Signs ED Triage Vitals  Encounter Vitals Group     BP 05/25/24 1015 (!) 195/81     Girls Systolic BP Percentile --      Girls Diastolic BP Percentile --      Boys Systolic BP Percentile --      Boys Diastolic BP Percentile --      Pulse Rate 05/25/24 1015 74     Resp 05/25/24 1015 15     Temp 05/25/24 1015 98.4 F (36.9 C)     Temp Source 05/25/24 1015 Oral     SpO2 05/25/24 1015 100 %     Weight 05/25/24 1014 249 lb 1.9 oz (113 kg)     Height 05/25/24 1014 5' 2 (1.575 m)     Head Circumference --      Peak Flow --      Pain Score 05/25/24 1014 0     Pain Loc --      Pain Education --      Exclude from Growth Chart --    No data found.  Updated Vital Signs BP (!) 173/90   Pulse 74   Temp 98.4 F (36.9 C) (Oral)   Resp 15   Ht 5' 2 (1.575 m)   Wt 249 lb 1.9 oz (113 kg)   LMP 04/30/2024 (Approximate)   SpO2 100%   BMI 45.56 kg/m   Visual Acuity Right Eye Distance:   Left Eye Distance:   Bilateral Distance:    Right Eye Near:   Left Eye Near:    Bilateral Near:     Physical Exam Vitals and nursing note reviewed.  Constitutional:      Appearance: Normal appearance. She is well-developed and well-groomed.  Cardiovascular:     Rate and Rhythm: Normal rate.  Pulmonary:     Effort: Pulmonary effort is normal.  Skin:    General: Skin is warm.     Findings: Rash present.     Comments: Discolored raised itchy rash to left index finger left elbow and neck, consistent with eczema  Neurological:     General: No focal deficit present.     Mental Status: She is alert and oriented to person, place, and time.     GCS: GCS eye subscore is 4. GCS verbal subscore is 5. GCS motor subscore is 6.  Psychiatric:        Attention and Perception: Attention normal.  Mood and Affect: Mood normal.         Speech: Speech normal.        Behavior: Behavior normal. Behavior is cooperative.      UC Treatments / Results  Labs (all labs ordered are listed, but only abnormal results are displayed) Labs Reviewed - No data to display  EKG   Radiology No results found.  Procedures Procedures (including critical care time)  Medications Ordered in UC Medications - No data to display  Initial Impression / Assessment and Plan / UC Course  I have reviewed the triage vital signs and the nursing notes.  Pertinent labs & imaging results that were available during my care of the patient were reviewed by me and considered in my medical decision making (see chart for details).    Discussed exam findings and plan of care: Use antihistamine of your choice(benadryl, zyrtec,etc) as directed for itching Use triamcinolone  as prescribed.  Follow up with PCP,dermatologist-keep appt Avoid heat,hot water as it makes rashes worse,avoid scratching. May apply cool compresses to areas to help with itching inflammation. Pt verbalized understanding to this provider.  Ddx: Flexural eczema, dermatitis(atopic vs allergic/contact), allergies, psoriasis, autoimmune rash Final Clinical Impressions(s) / UC Diagnoses   Final diagnoses:  Flexural eczema     Discharge Instructions      Use antihistamine of your choice(benadryl, zyrtec,etc) as directed for itching Use triamcinolone  as prescribed.  Follow up with PCP,dermatologist-keep appt Avoid heat,hot water as it makes rashes worse,avoid scratching. May apply cool compresses to areas to help with itching inflammation.      ED Prescriptions     Medication Sig Dispense Auth. Provider   triamcinolone  (KENALOG ) 0.025 % cream Apply topically 2 (two) times daily. Apply to affected areas,avoid face 15 g Oliviya Gilkison, Rilla, NP      PDMP not reviewed this encounter.     [1]  Social History Tobacco Use   Smoking status: Never   Smokeless tobacco: Never   Vaping Use   Vaping status: Never Used  Substance Use Topics   Alcohol use: Yes    Comment: occasional   Drug use: Not Currently     Aminta Rilla, NP 05/25/24 1501  "

## 2024-05-25 NOTE — ED Triage Notes (Signed)
 Patient c/o itchy bumps on her neck and left arm that started back after she finished her 6 day course of Prednisone .  Patient tried to get into Dermatologist.

## 2024-05-25 NOTE — Discharge Instructions (Addendum)
 Use antihistamine of your choice(benadryl, zyrtec,etc) as directed for itching Use triamcinolone  as prescribed.  Follow up with PCP,dermatologist-keep appt Avoid heat,hot water as it makes rashes worse,avoid scratching. May apply cool compresses to areas to help with itching inflammation.

## 2024-05-29 ENCOUNTER — Ambulatory Visit: Admitting: Nurse Practitioner

## 2024-05-29 VITALS — BP 110/76 | HR 60 | Temp 98.6°F | Ht 62.0 in | Wt 250.8 lb

## 2024-05-29 DIAGNOSIS — R7303 Prediabetes: Secondary | ICD-10-CM | POA: Diagnosis not present

## 2024-05-29 DIAGNOSIS — I10 Essential (primary) hypertension: Secondary | ICD-10-CM | POA: Diagnosis not present

## 2024-05-29 DIAGNOSIS — Z13220 Encounter for screening for lipoid disorders: Secondary | ICD-10-CM | POA: Diagnosis not present

## 2024-05-29 DIAGNOSIS — Z1329 Encounter for screening for other suspected endocrine disorder: Secondary | ICD-10-CM

## 2024-05-29 DIAGNOSIS — E559 Vitamin D deficiency, unspecified: Secondary | ICD-10-CM

## 2024-05-29 DIAGNOSIS — G4733 Obstructive sleep apnea (adult) (pediatric): Secondary | ICD-10-CM

## 2024-05-29 DIAGNOSIS — Z6841 Body Mass Index (BMI) 40.0 and over, adult: Secondary | ICD-10-CM

## 2024-05-29 DIAGNOSIS — K219 Gastro-esophageal reflux disease without esophagitis: Secondary | ICD-10-CM | POA: Diagnosis not present

## 2024-05-29 DIAGNOSIS — Z1231 Encounter for screening mammogram for malignant neoplasm of breast: Secondary | ICD-10-CM

## 2024-05-29 DIAGNOSIS — Z0001 Encounter for general adult medical examination with abnormal findings: Secondary | ICD-10-CM | POA: Insufficient documentation

## 2024-05-29 DIAGNOSIS — Z0000 Encounter for general adult medical examination without abnormal findings: Secondary | ICD-10-CM

## 2024-05-29 LAB — CBC WITH DIFFERENTIAL/PLATELET
Basophils Absolute: 0 K/uL (ref 0.0–0.1)
Basophils Relative: 0.9 % (ref 0.0–3.0)
Eosinophils Absolute: 0.1 K/uL (ref 0.0–0.7)
Eosinophils Relative: 1.8 % (ref 0.0–5.0)
HCT: 38.7 % (ref 36.0–46.0)
Hemoglobin: 12.9 g/dL (ref 12.0–15.0)
Lymphocytes Relative: 43.1 % (ref 12.0–46.0)
Lymphs Abs: 2.2 K/uL (ref 0.7–4.0)
MCHC: 33.4 g/dL (ref 30.0–36.0)
MCV: 83.3 fl (ref 78.0–100.0)
Monocytes Absolute: 0.4 K/uL (ref 0.1–1.0)
Monocytes Relative: 8.5 % (ref 3.0–12.0)
Neutro Abs: 2.3 K/uL (ref 1.4–7.7)
Neutrophils Relative %: 45.7 % (ref 43.0–77.0)
Platelets: 299 K/uL (ref 150.0–400.0)
RBC: 4.64 Mil/uL (ref 3.87–5.11)
RDW: 14.6 % (ref 11.5–15.5)
WBC: 5.1 K/uL (ref 4.0–10.5)

## 2024-05-29 LAB — VITAMIN D 25 HYDROXY (VIT D DEFICIENCY, FRACTURES): VITD: 51.43 ng/mL (ref 30.00–100.00)

## 2024-05-29 LAB — COMPREHENSIVE METABOLIC PANEL WITH GFR
ALT: 24 U/L (ref 3–35)
AST: 20 U/L (ref 5–37)
Albumin: 4.2 g/dL (ref 3.5–5.2)
Alkaline Phosphatase: 57 U/L (ref 39–117)
BUN: 15 mg/dL (ref 6–23)
CO2: 30 meq/L (ref 19–32)
Calcium: 9.4 mg/dL (ref 8.4–10.5)
Chloride: 101 meq/L (ref 96–112)
Creatinine, Ser: 0.95 mg/dL (ref 0.40–1.20)
GFR: 68.07 mL/min
Glucose, Bld: 76 mg/dL (ref 70–99)
Potassium: 4.3 meq/L (ref 3.5–5.1)
Sodium: 138 meq/L (ref 135–145)
Total Bilirubin: 0.9 mg/dL (ref 0.2–1.2)
Total Protein: 7 g/dL (ref 6.0–8.3)

## 2024-05-29 LAB — HEMOGLOBIN A1C: Hgb A1c MFr Bld: 5.9 % (ref 4.6–6.5)

## 2024-05-29 LAB — TSH: TSH: 0.77 u[IU]/mL (ref 0.35–5.50)

## 2024-05-29 LAB — LIPID PANEL
Cholesterol: 157 mg/dL (ref 28–200)
HDL: 37.3 mg/dL — ABNORMAL LOW
LDL Cholesterol: 104 mg/dL — ABNORMAL HIGH (ref 10–99)
NonHDL: 119.22
Total CHOL/HDL Ratio: 4
Triglycerides: 76 mg/dL (ref 10.0–149.0)
VLDL: 15.2 mg/dL (ref 0.0–40.0)

## 2024-05-29 MED ORDER — PANTOPRAZOLE SODIUM 40 MG PO TBEC: 40.0000 mg | DELAYED_RELEASE_TABLET | Freq: Every day | ORAL | 3 refills | Status: AC

## 2024-05-29 NOTE — Progress Notes (Signed)
 " Leron Glance, NP-C Phone: [phone]  Brandy Nelson is a 54 y.o. female who presents today for annual exam.   Discussed the use of AI scribe software for clinical note transcription with the patient, who gave verbal consent to proceed.  History of Present Illness   Brandy Nelson is a 54 year old female who presents for annual exam with concerns about her CPAP machine and persistent skin rashes.  She has been experiencing issues with her CPAP machine, noting increased water usage. The machine is approximately six to 55 years old, acquired before her move to [place] five years ago. She is uncertain about its optimal functioning and has not undergone a repeat sleep study recently.  She has persistent skin rashes initially diagnosed as contact dermatitis. Previously prescribed triamcinolone  and prednisone  provided initial improvement, but the rashes recurred after stopping prednisone . The rashes, located on her neck, arm, and elbow, cause itching. She applies triamcinolone  cream twice daily but feels the need for more frequent application due to persistent itching. She has a dermatology appointment scheduled for March, initially set in December.  She experiences symptoms of acid reflux, including frequent burping, chest pain, and burning sensations, despite taking famotidine . She previously used Protonix  effectively but discontinued it after a detox program resolved her symptoms for several years. The symptoms have recently returned, and she has not tried other medications since stopping Protonix .  She has a history of unsuccessful endometrial ablation for abnormal uterine bleeding due to insufficient dilation. She experienced two menstrual cycles in December and is considering medication options but has not started any treatment yet.  She reports occasional swelling in her left leg, which is not a new symptom. No chest pain, shortness of breath, abdominal pain, or changes in bowel habits.  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      Tobacco Use History[1]  Medications Ordered Prior to Encounter[2]   ROS see history of present illness  Objective  Physical Exam Vitals:   05/29/24 1130  BP: 110/76  Pulse: 60  Temp: 98.6 F (37 C)  SpO2: 98%    BP Readings from Last 3 Encounters:  06/04/24 120/80  05/29/24 110/76  05/25/24 (!) 173/90   Wt Readings from Last 3 Encounters:  06/04/24 246 lb (111.6 kg)  05/29/24 250 lb 12.8 oz (113.8 kg)  05/25/24 249 lb 1.9 oz (113 kg)    Physical Exam Constitutional:      General: She is not in acute distress.    Appearance: Normal appearance. She is obese.  HENT:     Head: Normocephalic.     Right Ear: Tympanic membrane normal.     Left Ear: Tympanic membrane normal.     Nose: Nose normal.     Mouth/Throat:     Mouth: Mucous membranes are moist.     Pharynx: Oropharynx is clear.  Eyes:     Conjunctiva/sclera: Conjunctivae normal.     Pupils: Pupils are equal, round, and reactive to light.  Neck:     Thyroid : No thyromegaly.  Cardiovascular:     Rate and Rhythm: Normal rate and regular rhythm.     Heart sounds: Normal heart sounds.  Pulmonary:     Effort: Pulmonary effort is normal.     Breath sounds: Normal breath sounds.  Abdominal:     General: Abdomen is flat. Bowel sounds are normal.     Palpations: Abdomen is soft. There is no mass.  Tenderness: There is no abdominal tenderness.  Musculoskeletal:        General: Normal range of motion.  Lymphadenopathy:     Cervical: No cervical adenopathy.  Skin:    General: Skin is warm and dry.     Findings: No rash.  Neurological:     General: No focal deficit present.     Mental Status: She is alert.  Psychiatric:        Mood and Affect: Mood normal.        Behavior: Behavior normal.     Assessment/Plan: Please see individual problem list.  Encounter for general adult  medical examination with abnormal findings Assessment & Plan: Physical exam complete. We will check lab work as outlined. Pap smear, mammogram and colonoscopy are all up to date. Flu and tetanus vaccines are up to date. She declines additional COVID vaccines. She is planning to get the Shingles vaccine from her local pharmacy. Continue routine dental and eye exams. Encourage healthy diet and regular exercise. Return to care in 6 months, sooner as needed.    Gastroesophageal reflux disease, unspecified whether esophagitis present Assessment & Plan: She experiences burping, chest pain, and a burning sensation. Previous treatment with Protonix  was effective, but symptoms have recurred with famotidine . Restart Protonix  for acid reflux management.  Orders: -     Pantoprazole  Sodium; Take 1 tablet (40 mg total) by mouth daily.  Dispense: 90 tablet; Refill: 3  OSA on CPAP Assessment & Plan: Her CPAP machine is seven years old and using more water than usual, indicating a possible issue with humidity settings. Referred to a sleep medicine specialist for evaluation and management. Adjust CPAP humidity settings as needed.  Orders: -     Pulmonary Visit  Hypertension, unspecified type Assessment & Plan: Blood pressure is well-controlled with hydrochlorothiazide . Continue current antihypertensive regimen.  Orders: -     CBC with Differential/Platelet -     Comprehensive metabolic panel with GFR  Prediabetes Assessment & Plan: Previous A1c indicated prediabetes. Discussed the importance of diet and exercise in managing blood sugar levels. Encourage a healthy diet and regular exercise. Advise monitoring portion sizes and reducing carbohydrate intake.  Orders: -     Hemoglobin A1c  Vitamin D  deficiency -     VITAMIN D  25 Hydroxy (Vit-D Deficiency, Fractures)  BMI 45.0-49.9, adult (HCC) -     Hemoglobin A1c  Thyroid  disorder screen -     TSH  Lipid screening -     Lipid panel  Screening  mammogram for breast cancer -     3D Screening Mammogram, Left and Right; Future     Return in about 6 months (around 11/26/2024) for Follow up.   Leron Glance, NP-C [facility]       [1]  Social History Tobacco Use  Smoking Status Never  Smokeless Tobacco Never  [2]  Current Outpatient Medications on File Prior to Visit  Medication Sig Dispense Refill   Cholecalciferol (VITAMIN D3) 50 MCG (2000 UT) CAPS Take 2,000 Units by mouth daily.     famotidine  (PEPCID ) 20 MG tablet Take 20 mg by mouth daily.     ibuprofen  (ADVIL ) 800 MG tablet Take 1 tablet (800 mg total) by mouth every 8 (eight) hours as needed for up to 42 doses. 42 tablet 0   Multiple Vitamin (MULTIVITAMIN) capsule Take 1 capsule by mouth daily.     predniSONE  (STERAPRED UNI-PAK 21 TAB) 10 MG (21) TBPK tablet Take by mouth daily.  Take 6 tabs by mouth daily  for 1 days, then 5 tabs for 1 days, then 4 tabs for 1 days, then 3 tabs for 1 days, 2 tabs for 1 days, then 1 tab by mouth daily for 1 days 21 tablet 0   Probiotic Product (PROBIOTIC PO) Take 1 tablet by mouth daily.     triamcinolone  (KENALOG ) 0.025 % cream Apply topically 2 (two) times daily. Apply to affected areas,avoid face 15 g 1   No current facility-administered medications on file prior to visit.   "

## 2024-05-29 NOTE — Patient Instructions (Signed)
 YOUR MAMMOGRAM IS DUE, PLEASE CALL AND GET THIS SCHEDULED! [facility] - call [phone]

## 2024-05-30 ENCOUNTER — Encounter: Payer: Self-pay | Admitting: Nurse Practitioner

## 2024-05-30 DIAGNOSIS — I10 Essential (primary) hypertension: Secondary | ICD-10-CM

## 2024-05-30 MED ORDER — HYDROCHLOROTHIAZIDE 25 MG PO TABS: 25.0000 mg | ORAL_TABLET | Freq: Every day | ORAL | 3 refills | Status: AC

## 2024-06-04 ENCOUNTER — Ambulatory Visit: Admitting: Sleep Medicine

## 2024-06-04 ENCOUNTER — Encounter: Payer: Self-pay | Admitting: Sleep Medicine

## 2024-06-04 VITALS — BP 120/80 | HR 75 | Temp 98.6°F | Ht 62.0 in | Wt 246.0 lb

## 2024-06-04 DIAGNOSIS — Z6841 Body Mass Index (BMI) 40.0 and over, adult: Secondary | ICD-10-CM

## 2024-06-04 DIAGNOSIS — I10 Essential (primary) hypertension: Secondary | ICD-10-CM | POA: Diagnosis not present

## 2024-06-04 DIAGNOSIS — G4733 Obstructive sleep apnea (adult) (pediatric): Secondary | ICD-10-CM | POA: Diagnosis not present

## 2024-06-04 NOTE — Progress Notes (Signed)
 "      Name:Brandy Nelson MRN: [id number] DOB: January 27, 1971   CHIEF COMPLAINT:  REASSESSMENT OF OSA   HISTORY OF PRESENT ILLNESS: Brandy Nelson is a 54 y.o. w/ a h/o OSA, HTN, morbid obesity and who presents for reassessment of OSA. Reports that she was initially diagnosed with OSA several years ago and was subsequently started on CPAP therapy. Reports using CPAP therapy every night, which is confirmed by compliance data. She is currently using the Airfit N20 nasal mask. Reports mouth breathing and would like to try a full face mask. Reports still feeling unrefreshed upon awakening with CPAP therapy.   Reports nocturnal awakenings due to nocturia, however does not have difficulty falling back to sleep. Denies any significant weight changes. Admits to dry mouth and night sweats. Denies morning headaches, RLS symptoms, dream enactment, cataplexy, hypnagogic or hypnapompic hallucinations. Denies a family history of sleep apnea. Denies drowsy driving. Drinks tea or soda occasionally, occasional alcohol use, denies tobacco or illicit drug use.   Bedtime 10 pm Sleep onset 15 mins Rise time 7 am   EPWORTH SLEEP SCORE 1    06/04/2024   10:00 AM  Results of the Epworth flowsheet  Sitting and reading 0  Watching TV 0  Sitting, inactive in a public place (e.g. a theatre or a meeting) 0  As a passenger in a car for an hour without a break 0  Lying down to rest in the afternoon when circumstances permit 0  Sitting and talking to someone 0  Sitting quietly after a lunch without alcohol 1  In a car, while stopped for a few minutes in traffic 0  Total score 1    PAST MEDICAL HISTORY :   has a past medical history of Arthritis, GERD (gastroesophageal reflux disease), Hypertension, Obesity, Osteoarthritis, and Sleep apnea.  has a past surgical history that includes Cholecystectomy (2001); Tendon repair (10/15/2019); Cesarean section (10/22/1999); Colonoscopy with propofol  (N/A, 09/22/2021); and  Dilatation & currettage/hysteroscopy with novasure ablation (N/A, 07/17/2023). Prior to Admission medications  Medication Sig Start Date End Date Taking? Authorizing Provider  Cholecalciferol (VITAMIN D3) 50 MCG (2000 UT) CAPS Take 2,000 Units by mouth daily.   Yes [provider]  famotidine  (PEPCID ) 20 MG tablet Take 20 mg by mouth daily. 01/13/24  Yes [provider]  hydrochlorothiazide  (HYDRODIURIL ) 25 MG tablet Take 1 tablet (25 mg total) by mouth daily. 05/30/24  Yes Gretel App, NP  ibuprofen  (ADVIL ) 800 MG tablet Take 1 tablet (800 mg total) by mouth every 8 (eight) hours as needed for up to 42 doses. 07/13/23  Yes Hines, Genesis V, MD  Multiple Vitamin (MULTIVITAMIN) capsule Take 1 capsule by mouth daily.   Yes [provider]  pantoprazole  (PROTONIX ) 40 MG tablet Take 1 tablet (40 mg total) by mouth daily. 05/29/24  Yes Gretel App, NP  predniSONE  (STERAPRED UNI-PAK 21 TAB) 10 MG (21) TBPK tablet Take by mouth daily. Take 6 tabs by mouth daily  for 1 days, then 5 tabs for 1 days, then 4 tabs for 1 days, then 3 tabs for 1 days, 2 tabs for 1 days, then 1 tab by mouth daily for 1 days 04/22/24  Yes White, Shelba R, NP  Probiotic Product (PROBIOTIC PO) Take 1 tablet by mouth daily.   Yes [provider]  triamcinolone  (KENALOG ) 0.025 % cream Apply topically 2 (two) times daily. Apply to affected areas,avoid face 05/25/24  Yes Defelice, Rilla, NP   Allergies[1]  FAMILY HISTORY:  family  history includes Healthy in her father; Hypertension in her maternal aunt and maternal grandmother; Sarcoidosis in her mother; Tuberculosis in her maternal grandfather. SOCIAL HISTORY:  reports that she has never smoked. She has never used smokeless tobacco. She reports current alcohol use. She reports that she does not currently use drugs.   Review of Systems:  Gen:  Denies  fever, sweats, chills weight loss  HEENT: Denies blurred vision, double vision, ear pain, eye  pain, hearing loss, nose bleeds, sore throat Cardiac:  No dizziness, chest pain or heaviness, chest tightness,edema, No JVD Resp:   No cough, -sputum production, -shortness of breath,-wheezing, -hemoptysis,  Gi: Denies swallowing difficulty, stomach pain, nausea or vomiting, diarrhea, constipation, bowel incontinence Gu:  Denies bladder incontinence, burning urine Ext:   Denies Joint pain, stiffness or swelling Skin: Denies  skin rash, easy bruising or bleeding or hives Endoc:  Denies polyuria, polydipsia , polyphagia or weight change Psych:   Denies depression, insomnia or hallucinations  Other:  All other systems negative  VITAL SIGNS: BP 120/80   Pulse 75   Temp 98.6 F (37 C)   Ht 5' 2 (1.575 m)   Wt 246 lb (111.6 kg)   LMP 04/30/2024 (Approximate)   SpO2 98%   BMI 44.99 kg/m    Physical Examination:   General Appearance: No distress  EYES PERRLA, EOM intact.   NECK Supple, No JVD Pulmonary: normal breath sounds, No wheezing.  CardiovascularNormal S1,S2.  No m/r/g.   Abdomen: Benign, Soft, non-tender. Skin:   warm, no rashes, no ecchymosis  Extremities: normal, no cyanosis, clubbing. Neuro:without focal findings,  speech normal  PSYCHIATRIC: Mood, affect within normal limits.   ASSESSMENT AND PLAN  OSA Will reassess apnea with HST. Patient is using and benefiting from CPAP therapy. For mask discomfort, will try patient on the Airtouch F30i FFM. Discussed the consequences of untreated sleep apnea. Advised not to drive drowsy for safety of patient and others. Will complete further evaluation with a home sleep study and follow up to review results.    HTN Stable, on current management. Following with PCP.   Morbid obesity Counseled patient on diet and lifestyle modification. Discussed starting on Zepbound and titrating as tolerated. Denies a family or personal history of thyroid  medullary CA or pancreatitis.    MEDICATION ADJUSTMENTS/LABS AND TESTS  ORDERED: Recommend Sleep Study   Patient  satisfied with Plan of action and management. All questions answered  Follow up to review HST results and treatment plan.   I spent a total of 67 minutes reviewing chart data, face-to-face evaluation with the patient, counseling and coordination of care as detailed above.    Nicklos Gaxiola, M.D.  Sleep Medicine [facility]           [1]  Allergies Allergen Reactions   Tramadol Nausea Only and Itching   "

## 2024-06-04 NOTE — Patient Instructions (Signed)
 Brandy Nelson

## 2024-06-05 ENCOUNTER — Ambulatory Visit: Payer: Self-pay | Admitting: Nurse Practitioner

## 2024-06-14 ENCOUNTER — Encounter

## 2024-06-14 DIAGNOSIS — G4733 Obstructive sleep apnea (adult) (pediatric): Secondary | ICD-10-CM

## 2024-06-17 ENCOUNTER — Encounter: Payer: Self-pay | Admitting: Nurse Practitioner

## 2024-06-17 NOTE — Assessment & Plan Note (Signed)
 Physical exam complete. We will check lab work as outlined. Pap smear, mammogram and colonoscopy are all up to date. Flu and tetanus vaccines are up to date. She declines additional COVID vaccines. She is planning to get the Shingles vaccine from her local pharmacy. Continue routine dental and eye exams. Encourage healthy diet and regular exercise. Return to care in 6 months, sooner as needed.

## 2024-06-17 NOTE — Assessment & Plan Note (Signed)
 She experiences burping, chest pain, and a burning sensation. Previous treatment with Protonix  was effective, but symptoms have recurred with famotidine . Restart Protonix  for acid reflux management.

## 2024-06-17 NOTE — Assessment & Plan Note (Signed)
 Her CPAP machine is seven years old and using more water than usual, indicating a possible issue with humidity settings. Referred to a sleep medicine specialist for evaluation and management. Adjust CPAP humidity settings as needed.

## 2024-06-17 NOTE — Assessment & Plan Note (Signed)
 Blood pressure is well-controlled with hydrochlorothiazide . Continue current antihypertensive regimen.

## 2024-06-17 NOTE — Assessment & Plan Note (Signed)
 Previous A1c indicated prediabetes. Discussed the importance of diet and exercise in managing blood sugar levels. Encourage a healthy diet and regular exercise. Advise monitoring portion sizes and reducing carbohydrate intake.

## 2024-10-17 ENCOUNTER — Encounter

## 2024-11-26 ENCOUNTER — Ambulatory Visit: Admitting: Nurse Practitioner
